# Patient Record
Sex: Female | Born: 1985 | Race: Black or African American | Hispanic: No | Marital: Single | Smoking: Current every day smoker
Health system: Southern US, Community
[De-identification: ages and names within clinical notes are randomized; demographics above are authoritative.]

## PROBLEM LIST (undated history)

## (undated) ENCOUNTER — Inpatient Hospital Stay: Payer: Self-pay

## (undated) DIAGNOSIS — T7840XA Allergy, unspecified, initial encounter: Secondary | ICD-10-CM

## (undated) DIAGNOSIS — G510 Bell's palsy: Secondary | ICD-10-CM

## (undated) DIAGNOSIS — E079 Disorder of thyroid, unspecified: Secondary | ICD-10-CM

## (undated) DIAGNOSIS — L732 Hidradenitis suppurativa: Secondary | ICD-10-CM

## (undated) DIAGNOSIS — R7303 Prediabetes: Secondary | ICD-10-CM

## (undated) HISTORY — DX: Disorder of thyroid, unspecified: E07.9

## (undated) HISTORY — DX: Allergy, unspecified, initial encounter: T78.40XA

## (undated) HISTORY — DX: Hidradenitis suppurativa: L73.2

## (undated) HISTORY — DX: Bell's palsy: G51.0

---
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
## 2008-10-03 ENCOUNTER — Ambulatory Visit: Payer: Self-pay | Admitting: Family Medicine

## 2009-02-10 ENCOUNTER — Inpatient Hospital Stay: Payer: Self-pay | Admitting: Obstetrics and Gynecology

## 2011-08-29 ENCOUNTER — Emergency Department: Payer: Self-pay | Admitting: Emergency Medicine

## 2011-08-29 LAB — URINALYSIS, COMPLETE
Bilirubin,UR: NEGATIVE
Glucose,UR: NEGATIVE mg/dL (ref 0–75)
Ph: 5 (ref 4.5–8.0)
Protein: NEGATIVE
Specific Gravity: 1.013 (ref 1.003–1.030)
Squamous Epithelial: 8

## 2011-08-29 LAB — PREGNANCY, URINE: Pregnancy Test, Urine: NEGATIVE m[IU]/mL

## 2011-08-29 LAB — WET PREP, GENITAL

## 2014-01-24 ENCOUNTER — Emergency Department: Payer: Self-pay | Admitting: Emergency Medicine

## 2014-02-03 DIAGNOSIS — G510 Bell's palsy: Secondary | ICD-10-CM

## 2014-02-03 HISTORY — DX: Bell's palsy: G51.0

## 2014-05-07 ENCOUNTER — Emergency Department
Admit: 2014-05-07 | Disposition: A | Discharge status: Home / Self Care | Payer: Self-pay | Admitting: Emergency Medicine

## 2014-05-07 LAB — COMPREHENSIVE METABOLIC PANEL
ALBUMIN: 3.8 g/dL
ANION GAP: 6 — AB (ref 7–16)
Alkaline Phosphatase: 55 U/L
BUN: 11 mg/dL
Bilirubin,Total: 0.3 mg/dL
CALCIUM: 9 mg/dL
CO2: 25 mmol/L
CREATININE: 0.99 mg/dL
Chloride: 106 mmol/L
EGFR (Non-African Amer.): >60
Glucose: 86 mg/dL
POTASSIUM: 4 mmol/L
SGOT(AST): 24 U/L
SGPT (ALT): 26 U/L
SODIUM: 137 mmol/L
Total Protein: 7.1 g/dL

## 2014-05-07 LAB — CBC WITH DIFFERENTIAL/PLATELET
BASOS ABS: 0 10*3/uL (ref 0.0–0.1)
Basophil %: 0.7 %
EOS ABS: 0.1 10*3/uL (ref 0.0–0.7)
Eosinophil %: 2.1 %
HCT: 41.1 % (ref 35.0–47.0)
HGB: 13.2 g/dL (ref 12.0–16.0)
LYMPHS PCT: 57.7 %
Lymphocyte #: 3.3 10*3/uL (ref 1.0–3.6)
MCH: 28.5 pg (ref 26.0–34.0)
MCHC: 32.1 g/dL (ref 32.0–36.0)
MCV: 89 fL (ref 80–100)
Monocyte #: 0.4 x10 3/mm (ref 0.2–0.9)
Monocyte %: 7.3 %
NEUTROS ABS: 1.9 10*3/uL (ref 1.4–6.5)
NEUTROS PCT: 32.2 %
Platelet: 242 10*3/uL (ref 150–440)
RBC: 4.65 10*6/uL (ref 3.80–5.20)
RDW: 13.3 % (ref 11.5–14.5)
WBC: 5.8 10*3/uL (ref 3.6–11.0)

## 2014-05-09 ENCOUNTER — Encounter: Payer: Self-pay | Admitting: *Deleted

## 2014-05-10 ENCOUNTER — Encounter: Payer: Self-pay | Admitting: Family Medicine

## 2014-05-10 ENCOUNTER — Ambulatory Visit: Payer: Self-pay | Admitting: Family Medicine

## 2014-05-10 NOTE — Progress Notes (Signed)
Patient was here to establish care however her insurance would not go through. 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 She was recently seen in the emergency room at [facility] on April 3 secondary to some numbness and tingling on the side of her face she thought she may have had a stroke. She had CT scan blood work was diagnosed with Bell's palsy. No history of Bell's palsy before. She did have some type of mild GI illness on Wednesday which was about 3 days before the symptoms started where she had some nausea and vomiting she felt bad for a couple days then begin to have neck pain and 17 with on the left side of her face and then the subsequent Bell's palsy. She is currently on prednisone she was given 5 days' worth as well as Valtrex for the next 8 days. She has no particular medical history. She does have seasonal allergies. She does not take medications on a regular basis. She is a smoker. Her family history is significant for diabetes and hypertension.  She was last [facility] by [facility] [place] she was also seen by GYN in January of this year however she had a miscarriage. She states that she had fasting blood work through that clinic but she does not recall the results.  PE-  GEN- NAD, alert and oriented x3 HEENT- PERRL, EOMI, non injected sclera, pink conjunctiva, MMM, oropharynx clear Neck- Supple, no thryomegaly CVS- RRR, no murmur RESP-CTAB Neuro- Left bells palsy of CNVII, decreased sensation, lid closes about 90% on left side, TM clear bilat, nares clear, orophyarnx clear, tongue midline, no other focal deficits EXT- No edema Pulses- Radial,  2+   Bell's Palsy- viral related, complete prednisone, Valtrex, anticipate complete resolution, she is using eye patch and lubricating drops I will obtain [facility]  She will not be billed for this visit

## 2014-05-17 ENCOUNTER — Ambulatory Visit: Payer: Self-pay | Admitting: Family Medicine

## 2014-06-16 ENCOUNTER — Other Ambulatory Visit: Payer: Self-pay | Admitting: Family Medicine

## 2014-06-16 MED ORDER — SULFAMETHOXAZOLE-TRIMETHOPRIM 800-160 MG PO TABS: 1.0000 | ORAL_TABLET | Freq: Two times a day (BID) | ORAL | Status: DC

## 2014-06-16 MED ORDER — FLUCONAZOLE 150 MG PO TABS: 150.0000 mg | ORAL_TABLET | Freq: Once | ORAL | Status: DC

## 2014-06-16 NOTE — Progress Notes (Signed)
Pt with history of recurrent boils in axilla and groin, has had multiple I and D in area. Currently has drainage out of right axilla for past month,   On exam- brief, no fever   Right axilla- draining area of hidadrenitis, TTP, yellow pus, non fluctanct+induration,   Pits and scar tissue noted in left axilla as well      Hidradenitis- will treat with bactrim DS x 10days   Discussed will eventually need surgical removal of area as it will continue to reoccur, no shaving or deodorant to area

## 2014-07-14 ENCOUNTER — Ambulatory Visit (INDEPENDENT_AMBULATORY_CARE_PROVIDER_SITE_OTHER): Payer: Managed Care, Other (non HMO) | Admitting: Family Medicine

## 2014-07-14 ENCOUNTER — Encounter: Payer: Self-pay | Admitting: Family Medicine

## 2014-07-14 VITALS — BP 128/70 | HR 86 | Temp 98.6°F | Resp 14 | Ht 62.0 in | Wt 211.0 lb

## 2014-07-14 DIAGNOSIS — L732 Hidradenitis suppurativa: Secondary | ICD-10-CM | POA: Insufficient documentation

## 2014-07-14 MED ORDER — MUPIROCIN CALCIUM 2 % NA OINT: 1.0000 "application " | TOPICAL_OINTMENT | Freq: Two times a day (BID) | NASAL | Status: DC

## 2014-07-14 MED ORDER — DOXYCYCLINE HYCLATE 100 MG PO TABS: 100.0000 mg | ORAL_TABLET | Freq: Two times a day (BID) | ORAL | Status: DC

## 2014-07-14 NOTE — Progress Notes (Signed)
Patient ID: Ashley Stanton, female   DOB: 02/05/1985, 29 y.o.   MRN: [id number]   Subjective:    Patient ID: Ashley Stanton, female    DOB: 1985/05/09, 29 y.o.   MRN: [id number]  Patient presents for Abscess under R Arm  here with recurrent abscesses beneath her right axilla she also gets on the left. 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 I gave her Bactrim and Diflucan about 2 weeks ago but the area still continues to drink significantly. She's had this on and off for many years and both axilla she also gets boils in her groin. She denies any fever nausea vomiting or other systemic symptoms. She has been treated with Bactroban nasally states that this does help with her staph infections. She also uses Hibiclens    Review Of Systems:  GEN- denies fatigue, fever, weight loss,weakness, recent illness HEENT- denies eye drainage, change in vision, nasal discharge, CVS- denies chest pain, palpitations RESP- denies SOB, cough, wheeze ABD- denies N/V, change in stools, abd pain GU- denies dysuria, hematuria, dribbling, incontinence MSK- denies joint pain, muscle aches, injury Neuro- denies headache, dizziness, syncope, seizure activity       Objective:    BP 128/70 mmHg  Pulse 86  Temp(Src) 98.6 F (37 C) (Oral)  Resp 14  Ht 5\' 2"  (1.575 m)  Wt 211 lb (95.709 kg)  BMI 38.58 kg/m2  LMP 06/28/2014 (Approximate) GEN- NAD, alert and oriented x3 Skin- Right axilla- swelling of axilla - 3cm area, mild fluctance and induration, with yellow drainage from 1cm opening-, TTP,  Left axilla- multiple healed pits           Assessment & Plan:      Problem List Items Addressed This Visit    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 Any transcriptional errors  that result from this process are unintentional.

## 2014-07-14 NOTE — Patient Instructions (Signed)
Surgery referral Take antibiotics as prescribed\ F/U as needed

## 2014-07-14 NOTE — Assessment & Plan Note (Addendum)
Will treat doxy x 7 days Bactroban to nares Based on recurrence will not I and D in office, send to general surgery to perform intervention- urgent appt to be made

## 2014-07-17 ENCOUNTER — Other Ambulatory Visit: Payer: Self-pay | Admitting: *Deleted

## 2014-07-17 MED ORDER — SULFAMETHOXAZOLE-TRIMETHOPRIM 800-160 MG PO TABS: 1.0000 | ORAL_TABLET | Freq: Two times a day (BID) | ORAL | Status: DC

## 2014-07-17 MED ORDER — MUPIROCIN CALCIUM 2 % EX CREA: 1.0000 "application " | TOPICAL_CREAM | Freq: Two times a day (BID) | CUTANEOUS | Status: DC

## 2014-07-21 ENCOUNTER — Other Ambulatory Visit: Payer: Self-pay | Admitting: *Deleted

## 2014-07-21 MED ORDER — MUPIROCIN 2 % EX OINT: 1.0000 "application " | TOPICAL_OINTMENT | Freq: Two times a day (BID) | CUTANEOUS | Status: DC

## 2014-09-19 ENCOUNTER — Telehealth: Payer: Self-pay | Admitting: *Deleted

## 2014-09-19 ENCOUNTER — Other Ambulatory Visit: Payer: Self-pay | Admitting: Family Medicine

## 2014-09-19 DIAGNOSIS — R7982 Elevated C-reactive protein (CRP): Secondary | ICD-10-CM

## 2014-09-19 MED ORDER — VITAMIN D (ERGOCALCIFEROL) 1.25 MG (50000 UNIT) PO CAPS: 50000.0000 [IU] | ORAL_CAPSULE | ORAL | Status: DC

## 2014-09-19 NOTE — Telephone Encounter (Signed)
Reviewed pt wellness labs, elevated HCRP and minimally elevated ferritin, had recent URI otherwise in normal state of health, no bone or joint pain, no muscle aches Wll recheck CRP in 4 weeks with ferritin  Will also replete her Vitamin D

## 2014-09-19 NOTE — Telephone Encounter (Signed)
Received patient labs.   MD reviewed and new orders obtained for Vit D 50,000IU PO Q week x12 weeks.   Prescription sent to pharmacy.

## 2014-10-13 ENCOUNTER — Other Ambulatory Visit: Payer: Self-pay | Admitting: *Deleted

## 2014-10-13 ENCOUNTER — Telehealth: Payer: Self-pay | Admitting: Family Medicine

## 2014-10-13 ENCOUNTER — Other Ambulatory Visit: Payer: Self-pay | Admitting: Family Medicine

## 2014-10-13 ENCOUNTER — Other Ambulatory Visit: Payer: Managed Care, Other (non HMO)

## 2014-10-13 DIAGNOSIS — R7982 Elevated C-reactive protein (CRP): Secondary | ICD-10-CM

## 2014-10-13 NOTE — Telephone Encounter (Signed)
Patient aware.

## 2014-10-13 NOTE — Telephone Encounter (Signed)
Please let patient know that I reviewed her medications with her current pregnancy, there are no good guidelines regarding the higher dose of vitamin D and pregnancy so have her decrease to just 1000 mg a day over-the-counter

## 2014-10-14 LAB — CBC WITH DIFFERENTIAL/PLATELET
BASOS PCT: 0 % (ref 0–1)
Basophils Absolute: 0 10*3/uL (ref 0.0–0.1)
EOS ABS: 0.1 10*3/uL (ref 0.0–0.7)
EOS PCT: 2 % (ref 0–5)
HCT: 36.2 % (ref 36.0–46.0)
Hemoglobin: 11.8 g/dL — ABNORMAL LOW (ref 12.0–15.0)
LYMPHS ABS: 2.6 10*3/uL (ref 0.7–4.0)
Lymphocytes Relative: 37 % (ref 12–46)
MCH: 28.4 pg (ref 26.0–34.0)
MCHC: 32.6 g/dL (ref 30.0–36.0)
MCV: 87 fL (ref 78.0–100.0)
MONOS PCT: 7 % (ref 3–12)
MPV: 9.3 fL (ref 8.6–12.4)
Monocytes Absolute: 0.5 10*3/uL (ref 0.1–1.0)
NEUTROS PCT: 54 % (ref 43–77)
Neutro Abs: 3.8 10*3/uL (ref 1.7–7.7)
PLATELETS: 253 10*3/uL (ref 150–400)
RBC: 4.16 MIL/uL (ref 3.87–5.11)
RDW: 14 % (ref 11.5–15.5)
WBC: 7.1 10*3/uL (ref 4.0–10.5)

## 2014-10-14 LAB — FERRITIN: FERRITIN: 183 ng/mL (ref 10–291)

## 2014-10-14 LAB — HIGH SENSITIVITY CRP: CRP, High Sensitivity: 39.3 mg/L — ABNORMAL HIGH

## 2014-10-16 ENCOUNTER — Other Ambulatory Visit: Payer: Self-pay | Admitting: Family Medicine

## 2014-10-17 LAB — ANA: ANA: NEGATIVE

## 2014-10-17 LAB — SEDIMENTATION RATE: Sed Rate: 36 mm/hr — ABNORMAL HIGH (ref 0–20)

## 2014-11-24 ENCOUNTER — Other Ambulatory Visit: Payer: Self-pay | Admitting: *Deleted

## 2014-11-24 ENCOUNTER — Telehealth: Payer: Self-pay | Admitting: *Deleted

## 2014-11-24 ENCOUNTER — Other Ambulatory Visit: Payer: Self-pay | Admitting: Family Medicine

## 2014-11-24 ENCOUNTER — Other Ambulatory Visit: Payer: Managed Care, Other (non HMO)

## 2014-11-24 DIAGNOSIS — Z3491 Encounter for supervision of normal pregnancy, unspecified, first trimester: Secondary | ICD-10-CM

## 2014-11-24 DIAGNOSIS — Z6839 Body mass index (BMI) 39.0-39.9, adult: Secondary | ICD-10-CM

## 2014-11-24 DIAGNOSIS — O0991 Supervision of high risk pregnancy, unspecified, first trimester: Secondary | ICD-10-CM

## 2014-11-24 DIAGNOSIS — O99211 Obesity complicating pregnancy, first trimester: Secondary | ICD-10-CM

## 2014-11-24 DIAGNOSIS — E669 Obesity, unspecified: Secondary | ICD-10-CM

## 2014-11-24 NOTE — Telephone Encounter (Signed)
Orders placed.   Labs obtained.   Will fax results to [facility] at [phone] phone/ [phone] fax.

## 2014-11-24 NOTE — Telephone Encounter (Signed)
Okay to draw labs

## 2014-11-24 NOTE — Telephone Encounter (Signed)
Patient seen at [facility].   OB requested labs to be drawn as follows: CBC, TSH, HBSAg, RPR, HIV, Type/Rh, antibody screen, HmgA1C, Varicella IGG, Rubella IGG.   Since patient works for [employer], blood work done here will have no out of pocket cost.   Ok to draw labs?

## 2014-11-24 NOTE — Addendum Note (Signed)
Addended by: Phillips OdorSIX, Aalaiyah Yassin H on: 11/24/2014 04:36 PM   Modules accepted: Orders

## 2014-11-25 LAB — ANTIBODY SCREEN: ANTIBODY SCREEN: NEGATIVE

## 2014-11-25 LAB — CBC WITH DIFFERENTIAL/PLATELET
Basophils Absolute: 0 10*3/uL (ref 0.0–0.1)
Basophils Relative: 0 % (ref 0–1)
EOS PCT: 0 % (ref 0–5)
Eosinophils Absolute: 0 10*3/uL (ref 0.0–0.7)
HEMATOCRIT: 34.1 % — AB (ref 36.0–46.0)
Hemoglobin: 11.6 g/dL — ABNORMAL LOW (ref 12.0–15.0)
Lymphocytes Relative: 27 % (ref 12–46)
Lymphs Abs: 2.3 10*3/uL (ref 0.7–4.0)
MCH: 28.9 pg (ref 26.0–34.0)
MCHC: 34 g/dL (ref 30.0–36.0)
MCV: 84.8 fL (ref 78.0–100.0)
MONO ABS: 0.3 10*3/uL (ref 0.1–1.0)
MONOS PCT: 4 % (ref 3–12)
MPV: 9.3 fL (ref 8.6–12.4)
NEUTROS ABS: 5.9 10*3/uL (ref 1.7–7.7)
NEUTROS PCT: 69 % (ref 43–77)
Platelets: 263 10*3/uL (ref 150–400)
RBC: 4.02 MIL/uL (ref 3.87–5.11)
RDW: 13.5 % (ref 11.5–15.5)
WBC: 8.5 10*3/uL (ref 4.0–10.5)

## 2014-11-25 LAB — ACUTE HEP PANEL AND HEP B SURFACE AB
HCV Ab: NEGATIVE
HEP B C IGM: NONREACTIVE
HEP B S AB: POSITIVE — AB
HEP B S AG: NEGATIVE
Hep A IgM: NONREACTIVE

## 2014-11-25 LAB — HEMOGLOBIN A1C
HEMOGLOBIN A1C: 5.2 % (ref <5.7)
MEAN PLASMA GLUCOSE: 103 mg/dL (ref <117)

## 2014-11-25 LAB — HIV ANTIBODY (ROUTINE TESTING W REFLEX): HIV: NONREACTIVE

## 2014-11-25 LAB — TSH: TSH: 0.51 u[IU]/mL (ref 0.350–4.500)

## 2014-11-25 LAB — RH TYPE: RH TYPE: NEGATIVE

## 2014-11-25 LAB — RPR

## 2014-11-27 LAB — VARICELLA ZOSTER ANTIBODY, IGG: Varicella IgG: 792.8 Index — ABNORMAL HIGH (ref <135.00)

## 2014-11-27 LAB — MEASLES/MUMPS/RUBELLA IMMUNITY
Mumps IgG: 35.4 AU/mL — ABNORMAL HIGH (ref <9.00)
RUBEOLA IGG: 21 [AU]/ml (ref <25.00)
Rubella: 7.13 Index — ABNORMAL HIGH (ref <0.90)

## 2014-11-29 LAB — ABO

## 2014-12-20 ENCOUNTER — Other Ambulatory Visit: Payer: Self-pay | Admitting: Family Medicine

## 2014-12-20 ENCOUNTER — Other Ambulatory Visit: Payer: Managed Care, Other (non HMO)

## 2014-12-20 DIAGNOSIS — Z789 Other specified health status: Secondary | ICD-10-CM

## 2014-12-21 LAB — HEPATITIS B SURFACE ANTIBODY, QUANTITATIVE: HEPATITIS B-POST: 457 m[IU]/mL

## 2014-12-26 ENCOUNTER — Ambulatory Visit (INDEPENDENT_AMBULATORY_CARE_PROVIDER_SITE_OTHER): Payer: Managed Care, Other (non HMO) | Admitting: Family Medicine

## 2014-12-26 ENCOUNTER — Encounter: Payer: Self-pay | Admitting: Family Medicine

## 2014-12-26 ENCOUNTER — Other Ambulatory Visit: Payer: Self-pay | Admitting: Family Medicine

## 2014-12-26 VITALS — BP 124/70 | HR 88 | Temp 99.2°F | Resp 16 | Ht 62.0 in | Wt 215.0 lb

## 2014-12-26 DIAGNOSIS — L732 Hidradenitis suppurativa: Secondary | ICD-10-CM

## 2014-12-26 MED ORDER — CEPHALEXIN 500 MG PO CAPS: 500.0000 mg | ORAL_CAPSULE | Freq: Two times a day (BID) | ORAL | Status: DC

## 2014-12-26 NOTE — Assessment & Plan Note (Signed)
S/P I and D  Continue warm compresses She is currently pregnant will treat with Keflex

## 2014-12-26 NOTE — Progress Notes (Signed)
Patient ID: Ashley Stanton, female   DOB: 08/27/1985, 29 y.o.   MRN: [id number]   Subjective:    Patient ID: Ashley InchesSharda A Glassner, female    DOB: 12/26/1985, 29 y.o.   MRN: [id number]  Patient presents for Recurrent Skin Infections   Patient here with recurrent abscess in her axilla. She is diagnosed with hidradenitis. She is currently [redacted] weeks pregnant. She has recurrent episodes of swelling and drainage from the area appear we treated with antibiotics in the past but this time she has significant hardened area in the axilla and it is artery draining. She's not had any fever. No change in her typical pregnancy symptoms of nausea.    Review Of Systems:  GEN- denies fatigue, fever, weight loss,weakness, recent illness HEENT- denies eye drainage, change in vision, nasal discharge, CVS- denies chest pain, palpitations RESP- denies SOB, cough, wheeze ABD- denies N/V, change in stools, abd pain MSK- denies joint pain, muscle aches, injury Neuro- denies headache, dizziness, syncope, seizure activity       Objective:    There were no vitals taken for this visit. GEN- NAD, alert and oriented x3 Skin - Right axilla- abscess- draining yellow/clear fluid, +induration, TTP, mild erythema    2 pits noted in axilla   Procedure- Incision and Drainage Procedure explained to patient questions answered benefits and risks discussed verbal consent obtained. 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 Any transcriptional errors that result from this process are unintentional.

## 2014-12-26 NOTE — Patient Instructions (Signed)
Take antibiotics as prescribed F/U as needed 

## 2014-12-27 ENCOUNTER — Ambulatory Visit: Payer: Self-pay | Admitting: *Deleted

## 2014-12-27 NOTE — Patient Instructions (Signed)
Remove 1inch guaze on Friday F/U with nurse or MD on Monday.

## 2014-12-27 NOTE — Progress Notes (Signed)
Patient ID: Ashley Stanton, female   DOB: 08/12/1985, 29 y.o.   MRN: [id number]  Patient seen in office to F/U I&D on R Axilla.   Moderate drainage of serosanguinous purulent noted.   1 inch of Idoform guaze removed.   Dressing re-applied.   Patient instructed to remove 1 inch Idoform guaze on Friday, 12/29/2014.  Will F/U on Monday 01/01/2015.

## 2014-12-29 LAB — WOUND CULTURE
GRAM STAIN: NONE SEEN
GRAM STAIN: NONE SEEN

## 2015-02-19 ENCOUNTER — Emergency Department
Admission: EM | Admit: 2015-02-19 | Discharge: 2015-02-19 | Disposition: A | Discharge status: Home / Self Care | Payer: Managed Care, Other (non HMO) | Attending: Emergency Medicine | Admitting: Emergency Medicine

## 2015-02-19 ENCOUNTER — Encounter: Payer: Self-pay | Admitting: Emergency Medicine

## 2015-02-19 DIAGNOSIS — M25511 Pain in right shoulder: Secondary | ICD-10-CM | POA: Diagnosis not present

## 2015-02-19 DIAGNOSIS — Z3A24 24 weeks gestation of pregnancy: Secondary | ICD-10-CM | POA: Insufficient documentation

## 2015-02-19 DIAGNOSIS — O99332 Smoking (tobacco) complicating pregnancy, second trimester: Secondary | ICD-10-CM | POA: Diagnosis not present

## 2015-02-19 DIAGNOSIS — R079 Chest pain, unspecified: Secondary | ICD-10-CM

## 2015-02-19 DIAGNOSIS — F17210 Nicotine dependence, cigarettes, uncomplicated: Secondary | ICD-10-CM | POA: Diagnosis not present

## 2015-02-19 DIAGNOSIS — O9989 Other specified diseases and conditions complicating pregnancy, childbirth and the puerperium: Secondary | ICD-10-CM | POA: Insufficient documentation

## 2015-02-19 DIAGNOSIS — Z3493 Encounter for supervision of normal pregnancy, unspecified, third trimester: Secondary | ICD-10-CM

## 2015-02-19 LAB — BASIC METABOLIC PANEL
Anion gap: 9 (ref 5–15)
BUN: 7 mg/dL (ref 6–20)
CALCIUM: 9 mg/dL (ref 8.9–10.3)
CO2: 19 mmol/L — AB (ref 22–32)
CREATININE: 0.68 mg/dL (ref 0.44–1.00)
Chloride: 109 mmol/L (ref 101–111)
GFR calc non Af Amer: >60 mL/min (ref >60)
GLUCOSE: 74 mg/dL (ref 65–99)
Potassium: 3.9 mmol/L (ref 3.5–5.1)
Sodium: 137 mmol/L (ref 135–145)

## 2015-02-19 LAB — URINALYSIS COMPLETE WITH MICROSCOPIC (ARMC ONLY)
BILIRUBIN URINE: NEGATIVE
Bacteria, UA: NONE SEEN
GLUCOSE, UA: NEGATIVE mg/dL
Nitrite: NEGATIVE
Protein, ur: 30 mg/dL — AB
Specific Gravity, Urine: 1.019 (ref 1.005–1.030)
pH: 5 (ref 5.0–8.0)

## 2015-02-19 LAB — CBC
HCT: 34.6 % — ABNORMAL LOW (ref 35.0–47.0)
Hemoglobin: 11.2 g/dL — ABNORMAL LOW (ref 12.0–16.0)
MCH: 28.3 pg (ref 26.0–34.0)
MCHC: 32.5 g/dL (ref 32.0–36.0)
MCV: 87.1 fL (ref 80.0–100.0)
PLATELETS: 222 10*3/uL (ref 150–440)
RBC: 3.97 MIL/uL (ref 3.80–5.20)
RDW: 14 % (ref 11.5–14.5)
WBC: 9 10*3/uL (ref 3.6–11.0)

## 2015-02-19 LAB — TROPONIN I: Troponin I: <0.03 ng/mL (ref <0.031)

## 2015-02-19 MED ORDER — CYCLOBENZAPRINE HCL 10 MG PO TABS: 10.0000 mg | ORAL_TABLET | Freq: Three times a day (TID) | ORAL | Status: DC | PRN

## 2015-02-19 NOTE — ED Notes (Signed)
Pt presents with midsternal chest pain radiating into right shoulder started yesterday. Pt is [redacted] weeks pregnant and denies any pregnancy related issues.

## 2015-02-19 NOTE — Discharge Instructions (Signed)
Chest Wall Pain Chest wall pain is pain in or around the bones and muscles of your chest. Sometimes, an injury causes this pain. Sometimes, the cause may not be known. This pain may take several weeks or longer to get better. HOME CARE INSTRUCTIONS  Pay attention to any changes in your symptoms. Take these actions to help with your pain:   Rest as told by your health care provider.   Avoid activities that cause pain. These include any activities that use your chest muscles or your abdominal and side muscles to lift heavy items.   If directed, apply ice to the painful area:  Put ice in a plastic bag.  Place a towel between your skin and the bag.  Leave the ice on for 20 minutes, 2-3 times per day.  Take over-the-counter and prescription medicines only as told by your health care provider.  Do not use tobacco products, including cigarettes, chewing tobacco, and e-cigarettes. If you need help quitting, ask your health care provider.  Keep all follow-up visits as told by your health care provider. This is important. SEEK MEDICAL CARE IF:  You have a fever.  Your chest pain becomes worse.  You have new symptoms. SEEK IMMEDIATE MEDICAL CARE IF:  You have nausea or vomiting.  You feel sweaty or light-headed.  You have a cough with phlegm (sputum) or you cough up blood.  You develop shortness of breath.   This information is not intended to replace advice given to you by your health care provider. Make sure you discuss any questions you have with your health care provider.   Document Released: 01/20/2005 Document Revised: 10/11/2014 Document Reviewed: 04/17/2014 Elsevier Interactive Patient Education [employer]  Third Trimester of Pregnancy The third trimester is from week 29 through week 42, months 7 through 9. The third trimester is a time when the fetus is growing rapidly. At the end of the ninth month, the fetus is about 20 inches in length and weighs 6-10 pounds.   BODY CHANGES Your body goes through many changes during pregnancy. The changes vary from woman to woman.   Your weight will continue to increase. You can expect to gain 25-35 pounds (11-16 kg) by the end of the pregnancy.  You may begin to get stretch marks on your hips, abdomen, and breasts.  You may urinate more often because the fetus is moving lower into your pelvis and pressing on your bladder.  You may develop or continue to have heartburn as a result of your pregnancy.  You may develop constipation because certain hormones are causing the muscles that push waste through your intestines to slow down.  You may develop hemorrhoids or swollen, bulging veins (varicose veins).  You may have pelvic pain because of the weight gain and pregnancy hormones relaxing your joints between the bones in your pelvis. Backaches may result from overexertion of the muscles supporting your posture.  You may have changes in your hair. These can include thickening of your hair, rapid growth, and changes in texture. Some women also have hair loss during or after pregnancy, or hair that feels dry or thin. Your hair will most likely return to normal after your baby is born.  Your breasts will continue to grow and be tender. A yellow discharge may leak from your breasts called colostrum.  Your belly button may stick out.  You may feel short of breath because of your expanding uterus.  You may notice the fetus "dropping," or moving lower  in your abdomen.  You may have a bloody mucus discharge. This usually occurs a few days to a week before labor begins.  Your cervix becomes thin and soft (effaced) near your due date. WHAT TO EXPECT AT YOUR PRENATAL EXAMS  You will have prenatal exams every 2 weeks until week 36. Then, you will have weekly prenatal exams. During a routine prenatal visit:  You will be weighed to make sure you and the fetus are growing normally.  Your blood pressure is taken.  Your  abdomen will be measured to track your baby's growth.  The fetal heartbeat will be listened to.  Any test results from the previous visit will be discussed.  You may have a cervical check near your due date to see if you have effaced. At around 36 weeks, your caregiver will check your cervix. At the same time, your caregiver will also perform a test on the secretions of the vaginal tissue. This test is to determine if a type of bacteria, Group B streptococcus, is present. Your caregiver will explain this further. Your caregiver may ask you:  What your birth plan is.  How you are feeling.  If you are feeling the baby move.  If you have had any abnormal symptoms, such as leaking fluid, bleeding, severe headaches, or abdominal cramping.  If you are using any tobacco products, including cigarettes, chewing tobacco, and electronic cigarettes.  If you have any questions. Other tests or screenings that may be performed during your third trimester include:  Blood tests that check for low iron levels (anemia).  Fetal testing to check the health, activity level, and growth of the fetus. Testing is done if you have certain medical conditions or if there are problems during the pregnancy.  HIV (human immunodeficiency virus) testing. If you are at high risk, you may be screened for HIV during your third trimester of pregnancy. FALSE LABOR You may feel small, irregular contractions that eventually go away. These are called Braxton Hicks contractions, or false labor. Contractions may last for hours, days, or even weeks before true labor sets in. If contractions come at regular intervals, intensify, or become painful, it is best to be seen by your caregiver.  SIGNS OF LABOR   Menstrual-like cramps.  Contractions that are 5 minutes apart or less.  Contractions that start on the top of the uterus and spread down to the lower abdomen and back.  A sense of increased pelvic pressure or back  pain.  A watery or bloody mucus discharge that comes from the vagina. If you have any of these signs before the 37th week of pregnancy, call your caregiver right away. You need to go to the hospital to get checked immediately. HOME CARE INSTRUCTIONS   Avoid all smoking, herbs, alcohol, and unprescribed drugs. These chemicals affect the formation and growth of the baby.  Do not use any tobacco products, including cigarettes, chewing tobacco, and electronic cigarettes. If you need help quitting, ask your health care provider. You may receive counseling support and other resources to help you quit.  Follow your caregiver's instructions regarding medicine use. There are medicines that are either safe or unsafe to take during pregnancy.  Exercise only as directed by your caregiver. Experiencing uterine cramps is a good sign to stop exercising.  Continue to eat regular, healthy meals.  Wear a good support bra for breast tenderness.  Do not use hot tubs, steam rooms, or saunas.  Wear your seat belt at all times when  driving.  Avoid raw meat, uncooked cheese, cat litter boxes, and soil used by cats. These carry germs that can cause birth defects in the baby.  Take your prenatal vitamins.  Take 1500-2000 mg of calcium daily starting at the 20th week of pregnancy until you deliver your baby.  Try taking a stool softener (if your caregiver approves) if you develop constipation. Eat more high-fiber foods, such as fresh vegetables or fruit and whole grains. Drink plenty of fluids to keep your urine clear or pale yellow.  Take warm sitz baths to soothe any pain or discomfort caused by hemorrhoids. Use hemorrhoid cream if your caregiver approves.  If you develop varicose veins, wear support hose. Elevate your feet for 15 minutes, 3-4 times a day. Limit salt in your diet.  Avoid heavy lifting, wear low heal shoes, and practice good posture.  Rest a lot with your legs elevated if you have leg  cramps or low back pain.  Visit your dentist if you have not gone during your pregnancy. Use a soft toothbrush to brush your teeth and be gentle when you floss.  A sexual relationship may be continued unless your caregiver directs you otherwise.  Do not travel far distances unless it is absolutely necessary and only with the approval of your caregiver.  Take prenatal classes to understand, practice, and ask questions about the labor and delivery.  Make a trial run to the hospital.  Pack your hospital bag.  Prepare the baby's nursery.  Continue to go to all your prenatal visits as directed by your caregiver. SEEK MEDICAL CARE IF:  You are unsure if you are in labor or if your water has broken.  You have dizziness.  You have mild pelvic cramps, pelvic pressure, or nagging pain in your abdominal area.  You have persistent nausea, vomiting, or diarrhea.  You have a bad smelling vaginal discharge.  You have pain with urination. SEEK IMMEDIATE MEDICAL CARE IF:   You have a fever.  You are leaking fluid from your vagina.  You have spotting or bleeding from your vagina.  You have severe abdominal cramping or pain.  You have rapid weight loss or gain.  You have shortness of breath with chest pain.  You notice sudden or extreme swelling of your face, hands, ankles, feet, or legs.  You have not felt your baby move in over an hour.  You have severe headaches that do not go away with medicine.  You have vision changes.   This information is not intended to replace advice given to you by your health care provider. Make sure you discuss any questions you have with your health care provider.   Document Released: 01/14/2001 Document Revised: 02/10/2014 Document Reviewed: 03/23/2012 Elsevier Interactive Patient Education [employer]

## 2015-02-19 NOTE — ED Provider Notes (Signed)
[facility] Emergency Department Provider Note     Time seen: ----------------------------------------- 5:10 PM on 02/19/2015 -----------------------------------------    I have reviewed the triage vital signs and the nursing notes.   HISTORY  Chief Complaint Chest Pain    HPI Latania A Ramnath is a 30 y.o. female who presents ER for midsternal chest pain and right shoulder pain that started yesterday. She states she is [redacted] weeks pregnant, denies any pregnancy related issues such as bleeding or abdominal pain. Movement of the chest and shoulders but her symptoms worse. She denies history of same.   Past Medical History  Diagnosis Date  . Allergy     SEASONAL  . Bell's palsy 05/08/2014    Patient Active Problem List   Diagnosis Date Noted  . Hidradenitis suppurativa of right axilla 07/14/2014    Past Surgical History  Procedure Laterality Date  . Cesarean section  02/11/2009    Allergies Review of patient's allergies indicates no known allergies.  Social History Social History  Substance Use Topics  . Smoking status: Current Every Day Smoker -- 0.50 packs/day    Types: Cigarettes  . Smokeless tobacco: Never Used  . Alcohol Use: 1.2 oz/week    2 Glasses of wine, 0 Cans of beer, 0 Shots of liquor per week    Review of Systems Constitutional: Negative for fever. Eyes: Negative for visual changes. ENT: Negative for sore throat. Cardiovascular: Positive for chest pain Respiratory: Negative for shortness of breath. Gastrointestinal: Negative for abdominal pain, vomiting and diarrhea. Genitourinary: Negative for dysuria. Negative for vaginal bleeding or leakage of fluid Musculoskeletal: Negative for back pain. Positive for shoulder pain Skin: Negative for rash. Neurological: Negative for headaches, focal weakness or numbness.  10-point ROS otherwise negative.  ____________________________________________   PHYSICAL EXAM:  VITAL  SIGNS: ED Triage Vitals  Enc Vitals Group     BP --      Pulse --      Resp --      Temp --      Temp src --      SpO2 --      Weight --      Height --      Head Cir --      Peak Flow --      Pain Score 02/19/15 1435 7     Pain Loc --      Pain Edu? --      Excl. in GC? --     Constitutional: Alert and oriented. Well appearing and in no distress. Eyes: Conjunctivae are normal. PERRL. Normal extraocular movements. ENT   Head: Normocephalic and atraumatic.   Nose: No congestion/rhinnorhea.   Mouth/Throat: Mucous membranes are moist.   Neck: No stridor. Cardiovascular: Normal rate, regular rhythm. Normal and symmetric distal pulses are present in all extremities. No murmurs, rubs, or gallops. Respiratory: Normal respiratory effort without tachypnea nor retractions. Breath sounds are clear and equal bilaterally. No wheezes/rales/rhonchi. Gastrointestinal: Soft and nontender. Gravid uterus Musculoskeletal: Pain with range of motion of the shoulders, particularly on the right. There is right upper chest wall tenderness Neurologic:  Normal speech and language. No gross focal neurologic deficits are appreciated. Speech is normal. No gait instability. Skin:  Skin is warm, dry and intact. No rash noted. Psychiatric: Mood and affect are normal. Speech and behavior are normal. Patient exhibits appropriate insight and judgment. ____________________________________________  EKG: Interpreted by me. Sinus tachycardia with a rate of 103 bpm, normal PR interval, normal QRS, normal  QT interval. Normal axis.  ____________________________________________  ED COURSE:  Pertinent labs & imaging results that were available during my care of the patient were reviewed by me and considered in my medical decision making (see chart for details). Patient is in no acute distress, likely musculoskeletal symptoms. I'll advise Tylenol for pain and prescribed Flexeril as needed.   ____________________________________________    LABS (pertinent positives/negatives)  Labs Reviewed  BASIC METABOLIC PANEL - Abnormal; Notable for the following:    CO2 19 (*)    All other components within normal limits  CBC - Abnormal; Notable for the following:    Hemoglobin 11.2 (*)    HCT 34.6 (*)    All other components within normal limits  URINALYSIS COMPLETEWITH MICROSCOPIC ([facility] ONLY) - Abnormal; Notable for the following:    Color, Urine YELLOW (*)    APPearance HAZY (*)    Ketones, ur TRACE (*)    Hgb urine dipstick 3+ (*)    Protein, ur 30 (*)    Leukocytes, UA 1+ (*)    Squamous Epithelial / LPF 6-30 (*)    All other components within normal limits  TROPONIN I   ____________________________________________  FINAL ASSESSMENT AND PLAN  Chest pain, third trimester pregnancy  Plan: Patient with labs and imaging as dictated above. Patient is in no acute distress, again I will prescribe Flexeril and advise close follow-up with doctor for reevaluation.   Emily FilbertWilliams, Aubriegh Minch E, MD   Emily FilbertJonathan E Jaspreet Bodner, MD 02/19/15 [phone]

## 2015-02-22 LAB — URINE CULTURE: SPECIAL REQUESTS: NORMAL

## 2015-03-15 ENCOUNTER — Encounter: Payer: Self-pay | Admitting: Family Medicine

## 2015-03-15 ENCOUNTER — Ambulatory Visit (INDEPENDENT_AMBULATORY_CARE_PROVIDER_SITE_OTHER): Payer: Managed Care, Other (non HMO) | Admitting: Family Medicine

## 2015-03-15 ENCOUNTER — Other Ambulatory Visit: Payer: Self-pay | Admitting: Family Medicine

## 2015-03-15 VITALS — BP 118/64 | HR 98 | Temp 99.2°F | Resp 18 | Wt 215.0 lb

## 2015-03-15 DIAGNOSIS — J09X2 Influenza due to identified novel influenza A virus with other respiratory manifestations: Secondary | ICD-10-CM

## 2015-03-15 DIAGNOSIS — R509 Fever, unspecified: Secondary | ICD-10-CM

## 2015-03-15 LAB — INFLUENZA A AND B AG, IMMUNOASSAY
INFLUENZA A ANTIGEN: DETECTED — AB
Influenza B Antigen: NOT DETECTED

## 2015-03-15 MED ORDER — OSELTAMIVIR PHOSPHATE 75 MG PO CAPS
75.0000 mg | ORAL_CAPSULE | Freq: Two times a day (BID) | ORAL | Status: DC
Start: 2015-03-15 — End: 2015-06-14

## 2015-03-15 NOTE — Progress Notes (Signed)
Subjective:    Patient ID: Ashley Stanton, female    DOB: [birth year], 30 y.o.   MRN: [id number]  HPI  Patient is a 30 y/o AAF who is [redacted] weeks pregnant who developed fever, body aches, tender cervical LAD, congestion and sore throat yesterday.  Performed flu test this morning which was positive.  Did not have flu shot.   Past Medical History  Diagnosis Date  . Allergy     SEASONAL  . Bell's palsy 05/08/2014   Past Surgical History  Procedure Laterality Date  . Cesarean section  02/11/2009   Current Outpatient Prescriptions on File Prior to Visit  Medication Sig Dispense Refill  . Prenatal Vit-Fe Fumarate-FA (MULTIVITAMIN-PRENATAL) 27-0.8 MG TABS tablet Take 1 tablet by mouth daily at 12 noon.    . valACYclovir (VALTREX) 1000 MG tablet Take 1,000 mg by mouth 2 (two) times daily.    . Vitamin D, Ergocalciferol, (DRISDOL) 50000 UNITS CAPS capsule Take 1 capsule (50,000 Units total) by mouth every 7 (seven) days. Take (1) capsule by mouth every week x12 weeks, then stop. 12 capsule 0  . cyclobenzaprine (FLEXERIL) 10 MG tablet Take 1 tablet (10 mg total) by mouth every 8 (eight) hours as needed for muscle spasms. (Patient not taking: Reported on 03/15/2015) 30 tablet 1   No current facility-administered medications on file prior to visit.   No Known Allergies Social History   Social History  . Marital Status: Single    Spouse Name: N/A  . Number of Children: N/A  . Years of Education: N/A   Occupational History  . Not on file.   Social History Main Topics  . Smoking status: Current Every Day Smoker -- 0.50 packs/day    Types: Cigarettes  . Smokeless tobacco: Never Used  . Alcohol Use: 1.2 oz/week    2 Glasses of wine, 0 Cans of beer, 0 Shots of liquor per week  . Drug Use: No  . Sexual Activity: Yes    Birth Control/ Protection: Condom   Other Topics Concern  . Not on file   Social History Narrative     Review of Systems  All other systems reviewed and are  negative.      Objective:   Physical Exam  Constitutional: She appears well-developed and well-nourished. No distress.  HENT:  Right Ear: External ear normal. Tympanic membrane is erythematous.  Left Ear: Tympanic membrane and external ear normal.  Nose: Nose normal.  Mouth/Throat: Oropharynx is clear and moist. No oropharyngeal exudate.  Eyes: Conjunctivae are normal. Pupils are equal, round, and reactive to light.  Neck: Normal range of motion. Neck supple.  Cardiovascular: Normal rate, regular rhythm and normal heart sounds.   Pulmonary/Chest: Effort normal and breath sounds normal. No respiratory distress. She has no wheezes. She has no rales.  Abdominal: Soft. Bowel sounds are normal.  Lymphadenopathy:    She has cervical adenopathy.  Skin: She is not diaphoretic.  Vitals reviewed.         Assessment & Plan:  Fever, unspecified - Plan: Influenza a and b  Influenza due to identified novel influenza A virus with other respiratory manifestations - Plan: oseltamivir (TAMIFLU) 75 MG capsule   symptoms are consistent with a viral syndrome similar to the flu. Given the positive fu test at work this morning, I will treat the patient as influenza with Tamiflu 75 mg by mouth twice a day for 5 days. Given the fact she is [redacted] weeks pregnant I have recommended Tylenol an  Tylenol only for fever and body aches. Also recommended rest and push fluids. She should not return to work for at least 5 days or until afebrile.

## 2015-04-11 ENCOUNTER — Encounter: Payer: Self-pay | Admitting: Family Medicine

## 2015-04-11 ENCOUNTER — Ambulatory Visit (INDEPENDENT_AMBULATORY_CARE_PROVIDER_SITE_OTHER): Payer: Managed Care, Other (non HMO) | Admitting: Family Medicine

## 2015-04-11 VITALS — BP 118/72 | HR 96 | Temp 98.8°F | Resp 18 | Wt 213.0 lb

## 2015-04-11 DIAGNOSIS — N760 Acute vaginitis: Secondary | ICD-10-CM | POA: Diagnosis not present

## 2015-04-11 DIAGNOSIS — A499 Bacterial infection, unspecified: Secondary | ICD-10-CM | POA: Diagnosis not present

## 2015-04-11 DIAGNOSIS — B9689 Other specified bacterial agents as the cause of diseases classified elsewhere: Secondary | ICD-10-CM

## 2015-04-11 MED ORDER — METRONIDAZOLE 500 MG PO TABS: 500.0000 mg | ORAL_TABLET | Freq: Two times a day (BID) | ORAL | Status: DC

## 2015-04-11 NOTE — Patient Instructions (Signed)
Take antibiotics No bubble baths Return to previous soap We will call with culture results F/U as needed

## 2015-04-11 NOTE — Progress Notes (Signed)
Patient ID: Ashley Stanton, female   DOB: 05/18/1985, 30 y.o.   MRN: [id number]   Subjective:    Patient ID: Ashley Stanton, female    DOB: 10/12/1985, 30 y.o.   MRN: [id number]  Patient presents for Vaginal Pain  Vaginal pain for 5 days, using dial soap, tried new body wash, burning sensation and pain  When she wipes. She also feels a swollen area which she has had boils in the vaginal region the inguinal region as well. She denies any true dysuria or urinary frequency other than her typical for pregnancy. She is currently [redacted] weeks pregnant. She's not had any vaginal bleeding. She has good fetal movement.      Review Of Systems:  GEN- denies fatigue, fever, weight loss,weakness, recent illness HEENT- denies eye drainage, change in vision, nasal discharge, CVS- denies chest pain, palpitations RESP- denies SOB, cough, wheeze ABD- denies N/V, change in stools, abd pain GU- denies dysuria, hematuria, dribbling, incontinence MSK- denies joint pain, muscle aches, injury Neuro- denies headache, dizziness, syncope, seizure activity       Objective:    BP 118/72 mmHg  Pulse 96  Temp(Src) 98.8 F (37.1 C) (Oral)  Resp 18  Wt 213 lb (96.616 kg)  LMP 06/28/2014 (Approximate) GEN- NAD, alert and oriented x3 HEENT- PERRL, EOMI, non injected sclera, pink conjunctiva, MMM, oropharynx clear Neck- Supple, no thyromegaly CVS- RRR, no murmur RESP-CTAB ABD-NABS,soft,NT- GRAVID, no CVA tenderness  GU- normal external genitalia, vaginal mucosa pink and moist, - blind wet prep done, left lower labia erythema and small swelling- TTP, skin irritated, no pus expressed  EXT- No edema Pulses- Radial, 2+  Wet prep- Moderate Clue cells, whitescells , no Yeast         Assessment & Plan:      Problem List Items Addressed This Visit    None    Visit Diagnoses    BV (bacterial vaginosis)    -  Primary    Flagyl for BV , send urine for culture, though no true UTI symptoms, she has small area of  swelling on lower left labia, possible boil starting. No bubble baths    Relevant Medications    metroNIDAZOLE (FLAGYL) 500 MG tablet    Other Relevant Orders    Urinalysis, Routine w reflex microscopic (not [facility])    WET PREP FOR TRICH, YEAST, CLUE    Urine culture    Vaginitis and vulvovaginitis        Return to previous soap       Note: This dictation was prepared with Dragon dictation along with smaller phrase technology. Any transcriptional errors that result from this process are unintentional.

## 2015-04-26 ENCOUNTER — Encounter: Payer: Self-pay | Admitting: Family Medicine

## 2015-05-15 LAB — OB RESULTS CONSOLE GBS: GBS: NEGATIVE

## 2015-06-14 ENCOUNTER — Observation Stay
Admission: EM | Admit: 2015-06-14 | Discharge: 2015-06-14 | Disposition: A | Discharge status: Home / Self Care | Payer: Managed Care, Other (non HMO) | Attending: Obstetrics and Gynecology | Admitting: Obstetrics and Gynecology

## 2015-06-14 DIAGNOSIS — Z3A40 40 weeks gestation of pregnancy: Secondary | ICD-10-CM | POA: Insufficient documentation

## 2015-06-14 DIAGNOSIS — O471 False labor at or after 37 completed weeks of gestation: Principal | ICD-10-CM | POA: Insufficient documentation

## 2015-06-14 DIAGNOSIS — O479 False labor, unspecified: Secondary | ICD-10-CM | POA: Diagnosis present

## 2015-06-14 NOTE — Final Progress Note (Signed)
Physician Final Progress Note  Patient ID: Ashley InchesSharda A Fairbank MRN: [id number] DOB/AGE: 31/05/1985 30 y.o.  Admit date: 06/14/2015 Admitting provider: Vena AustriaAndreas Brennley Curtice, MD Discharge date: 06/14/2015   Admission Diagnoses: Irregular contractions  Discharge Diagnoses:  Active Problems:   Irregular contractions   Consults: None  Significant Findings/ Diagnostic Studies: none  Procedures: NST 150, moderate, variability, +accels, no decels  Discharge Condition: good  Disposition: 01-Home or Self Care  Diet: Regular diet  Discharge Activity: Activity as tolerated  Discharge Instructions    Discharge activity:  No Restrictions    Complete by:  As directed      Fetal Kick Count:  Lie on our left side for one hour after a meal, and count the number of times your baby kicks.  If it is less than 5 times, get up, move around and drink some juice.  Repeat the test 30 minutes later.  If it is still less than 5 kicks in an hour, notify your doctor.    Complete by:  As directed      LABOR:  When conractions begin, you should start to time them from the beginning of one contraction to the beginning  of the next.  When contractions are 5 - 10 minutes apart or less and have been regular for at least an hour, you should call your health care provider.    Complete by:  As directed      No sexual activity restrictions    Complete by:  As directed      Notify physician for bleeding from the vagina    Complete by:  As directed      Notify physician for blurring of vision or spots before the eyes    Complete by:  As directed      Notify physician for chills or fever    Complete by:  As directed      Notify physician for fainting spells, "black outs" or loss of consciousness    Complete by:  As directed      Notify physician for increase in vaginal discharge    Complete by:  As directed      Notify physician for leaking of fluid    Complete by:  As directed      Notify physician for pain or burning  when urinating    Complete by:  As directed      Notify physician for pelvic pressure (sudden increase)    Complete by:  As directed      Notify physician for severe or continued nausea or vomiting    Complete by:  As directed      Notify physician for sudden gushing of fluid from the vagina (with or without continued leaking)    Complete by:  As directed      Notify physician for sudden, constant, or occasional abdominal pain    Complete by:  As directed      Notify physician if baby moving less than usual    Complete by:  As directed             Medication List    STOP taking these medications        metroNIDAZOLE 500 MG tablet  Commonly known as:  FLAGYL     oseltamivir 75 MG capsule  Commonly known as:  TAMIFLU      TAKE these medications        cyclobenzaprine 10 MG tablet  Commonly known as:  FLEXERIL  Take 1 tablet (  10 mg total) by mouth every 8 (eight) hours as needed for muscle spasms.     multivitamin-prenatal 27-0.8 MG Tabs tablet  Take 1 tablet by mouth daily at 12 noon.     valACYclovir 1000 MG tablet  Commonly known as:  VALTREX  Take 1,000 mg by mouth 2 (two) times daily.     Vitamin D (Ergocalciferol) 50000 units Caps capsule  Commonly known as:  DRISDOL  Take 1 capsule (50,000 Units total) by mouth every 7 (seven) days. Take (1) capsule by mouth every week x12 weeks, then stop.           Follow-up Information    Follow up with Letitia Libra, MD On 06/18/2015.   Specialty:  Obstetrics and Gynecology   Contact information:   [address] [place] [address] [phone]       Total time spent taking care of this patient: 10 minutes  Signed: Lorrene Reid 06/14/2015, 4:07 PM

## 2015-06-14 NOTE — Plan of Care (Signed)
Pt presents to l/d with c/o contractions that started this morning. Pt called office and was told to come to l/d so she wouldn't miss her epidural. Pt is a previous c section. [facility] but now states she is scheduled for a csection on Tuesday may 16. Dr Bonney Aidstaebler notified

## 2015-06-14 NOTE — Plan of Care (Signed)
Pt d/c home with d/c instructions.  efm reviewed by dr Bonney Aidstaebler. Pt to return on Tuesday morning at 0530 for scheduled csection . Discussed pain management of on q pump. Pt verbalizes understanding

## 2015-06-17 ENCOUNTER — Inpatient Hospital Stay
Admission: RE | Admit: 2015-06-17 | Payer: Managed Care, Other (non HMO) | Source: Ambulatory Visit | Admitting: Obstetrics & Gynecology

## 2015-06-17 ENCOUNTER — Inpatient Hospital Stay
Admission: EM | Admit: 2015-06-17 | Discharge: 2015-06-20 | DRG: 765 | Disposition: A | Discharge status: Home / Self Care | Payer: Managed Care, Other (non HMO) | Attending: Obstetrics and Gynecology | Admitting: Obstetrics and Gynecology

## 2015-06-17 ENCOUNTER — Observation Stay: Payer: Managed Care, Other (non HMO) | Admitting: Anesthesiology

## 2015-06-17 ENCOUNTER — Encounter
Admission: EM | Disposition: A | Discharge status: Home / Self Care | Payer: Self-pay | Source: Home / Self Care | Attending: Obstetrics and Gynecology

## 2015-06-17 DIAGNOSIS — D62 Acute posthemorrhagic anemia: Secondary | ICD-10-CM | POA: Diagnosis present

## 2015-06-17 DIAGNOSIS — Z6839 Body mass index (BMI) 39.0-39.9, adult: Secondary | ICD-10-CM

## 2015-06-17 DIAGNOSIS — O26893 Other specified pregnancy related conditions, third trimester: Secondary | ICD-10-CM | POA: Diagnosis present

## 2015-06-17 DIAGNOSIS — Z98891 History of uterine scar from previous surgery: Secondary | ICD-10-CM

## 2015-06-17 DIAGNOSIS — O480 Post-term pregnancy: Secondary | ICD-10-CM | POA: Diagnosis present

## 2015-06-17 DIAGNOSIS — Z3A41 41 weeks gestation of pregnancy: Secondary | ICD-10-CM

## 2015-06-17 DIAGNOSIS — Z6741 Type O blood, Rh negative: Secondary | ICD-10-CM

## 2015-06-17 DIAGNOSIS — Z23 Encounter for immunization: Secondary | ICD-10-CM

## 2015-06-17 DIAGNOSIS — F17210 Nicotine dependence, cigarettes, uncomplicated: Secondary | ICD-10-CM | POA: Diagnosis present

## 2015-06-17 DIAGNOSIS — O34211 Maternal care for low transverse scar from previous cesarean delivery: Principal | ICD-10-CM | POA: Diagnosis present

## 2015-06-17 DIAGNOSIS — O99214 Obesity complicating childbirth: Secondary | ICD-10-CM | POA: Diagnosis present

## 2015-06-17 DIAGNOSIS — O99334 Smoking (tobacco) complicating childbirth: Secondary | ICD-10-CM | POA: Diagnosis present

## 2015-06-17 LAB — CBC
HCT: 37 % (ref 35.0–47.0)
Hemoglobin: 12.3 g/dL (ref 12.0–16.0)
MCH: 30.1 pg (ref 26.0–34.0)
MCHC: 33.4 g/dL (ref 32.0–36.0)
MCV: 90.1 fL (ref 80.0–100.0)
Platelets: 160 10*3/uL (ref 150–440)
RBC: 4.1 MIL/uL (ref 3.80–5.20)
RDW: 14.1 % (ref 11.5–14.5)
WBC: 8.9 10*3/uL (ref 3.6–11.0)

## 2015-06-17 LAB — TYPE AND SCREEN
ABO/RH(D): O NEG
ANTIBODY SCREEN: NEGATIVE

## 2015-06-17 SURGERY — Surgical Case
Anesthesia: Spinal | Wound class: Clean Contaminated

## 2015-06-17 MED ORDER — LACTATED RINGERS IV SOLN
INTRAVENOUS | Status: DC | PRN
  Administered 2015-06-17 – 2015-06-18 (×3): via INTRAVENOUS

## 2015-06-17 MED ORDER — BUPIVACAINE HCL (PF) 0.5 % IJ SOLN: 5.0000 mL | Freq: Once | INTRAMUSCULAR | Status: DC

## 2015-06-17 MED ORDER — CEFAZOLIN SODIUM-DEXTROSE 2-4 GM/100ML-% IV SOLN
INTRAVENOUS | Status: AC
  Administered 2015-06-17: 2 g via INTRAVENOUS
  Filled 2015-06-17: qty 100

## 2015-06-17 MED ORDER — OXYTOCIN 40 UNITS IN LACTATED RINGERS INFUSION - SIMPLE MED
INTRAVENOUS | Status: AC
  Administered 2015-06-18 (×2): 4 mL via INTRAVENOUS
  Filled 2015-06-17: qty 1000

## 2015-06-17 MED ORDER — BUPIVACAINE 0.25 % ON-Q PUMP DUAL CATH 400 ML: 400.0000 mL | INJECTION | Status: DC

## 2015-06-17 MED ORDER — CITRIC ACID-SODIUM CITRATE 334-500 MG/5ML PO SOLN
ORAL | Status: AC
  Administered 2015-06-17: 30 mL via ORAL
  Filled 2015-06-17: qty 15

## 2015-06-17 MED ORDER — CITRIC ACID-SODIUM CITRATE 334-500 MG/5ML PO SOLN
30.0000 mL | ORAL | Status: AC
  Administered 2015-06-17: 30 mL via ORAL

## 2015-06-17 MED ORDER — BUPIVACAINE HCL (PF) 0.5 % IJ SOLN
INTRAMUSCULAR | Status: AC
  Filled 2015-06-17: qty 30

## 2015-06-17 MED ORDER — CEFAZOLIN SODIUM-DEXTROSE 2-4 GM/100ML-% IV SOLN
2.0000 g | INTRAVENOUS | Status: AC
  Administered 2015-06-17: 2 g via INTRAVENOUS

## 2015-06-17 SURGICAL SUPPLY — 28 items
CANISTER SUCT 3000ML (MISCELLANEOUS) ×2 IMPLANT
CATH KIT ON-Q SILVERSOAK 5IN (CATHETERS) ×4 IMPLANT
DRSG OPSITE POSTOP 4X10 (GAUZE/BANDAGES/DRESSINGS) IMPLANT
DRSG TELFA 3X8 NADH (GAUZE/BANDAGES/DRESSINGS) ×2 IMPLANT
ELECT CAUTERY BLADE 6.4 (BLADE) ×2 IMPLANT
ELECT REM PT RETURN 9FT ADLT (ELECTROSURGICAL) ×2
ELECTRODE REM PT RTRN 9FT ADLT (ELECTROSURGICAL) ×1 IMPLANT
GAUZE SPONGE 4X4 12PLY STRL (GAUZE/BANDAGES/DRESSINGS) ×2 IMPLANT
GLOVE BIO SURGEON STRL SZ7 (GLOVE) ×6 IMPLANT
GLOVE INDICATOR 7.5 STRL GRN (GLOVE) ×6 IMPLANT
GOWN STRL REUS W/ TWL LRG LVL3 (GOWN DISPOSABLE) ×3 IMPLANT
GOWN STRL REUS W/TWL LRG LVL3 (GOWN DISPOSABLE) ×3
LIQUID BAND (GAUZE/BANDAGES/DRESSINGS) ×2 IMPLANT
NS IRRIG 1000ML POUR BTL (IV SOLUTION) ×2 IMPLANT
PACK C SECTION AR (MISCELLANEOUS) ×2 IMPLANT
PAD OB MATERNITY 4.3X12.25 (PERSONAL CARE ITEMS) ×2 IMPLANT
PAD PREP 24X41 OB/GYN DISP (PERSONAL CARE ITEMS) ×2 IMPLANT
SPONGE LAP 18X18 5 PK (GAUZE/BANDAGES/DRESSINGS) ×4 IMPLANT
STRIP CLOSURE SKIN 1/2X4 (GAUZE/BANDAGES/DRESSINGS) ×2 IMPLANT
SUT CHROMIC GUT BROWN 0 54 (SUTURE) IMPLANT
SUT CHROMIC GUT BROWN 0 54IN (SUTURE)
SUT MNCRL 4-0 (SUTURE) ×1
SUT MNCRL 4-0 27XMFL (SUTURE) ×1
SUT PDS AB 1 TP1 96 (SUTURE) ×2 IMPLANT
SUT PLAIN 2 0 XLH (SUTURE) ×2 IMPLANT
SUT VIC AB 0 CT1 36 (SUTURE) ×6 IMPLANT
SUTURE MNCRL 4-0 27XMF (SUTURE) ×1 IMPLANT
SWABSTK COMLB BENZOIN TINCTURE (MISCELLANEOUS) ×2 IMPLANT

## 2015-06-17 NOTE — Anesthesia Preprocedure Evaluation (Signed)
Anesthesia Evaluation  Patient identified by MRN, date of birth, ID band Patient awake    Reviewed: Allergy & Precautions, NPO status , Patient's Chart, lab work & pertinent test results  Airway Mallampati: II       Dental  (+) Teeth Intact   Pulmonary Current Smoker,     + decreased breath sounds      Cardiovascular Exercise Tolerance: Good  Rhythm:Regular     Neuro/Psych    GI/Hepatic negative GI ROS, Neg liver ROS,   Endo/Other  negative endocrine ROS  Renal/GU negative Renal ROS     Musculoskeletal   Abdominal Normal abdominal exam  (+)   Peds  Hematology negative hematology ROS (+)   Anesthesia Other Findings   Reproductive/Obstetrics                             Anesthesia Physical Anesthesia Plan  ASA: II and emergent  Anesthesia Plan: Spinal   Post-op Pain Management:    Induction:   Airway Management Planned: Natural Airway  Additional Equipment:   Intra-op Plan:   Post-operative Plan:   Informed Consent: I have reviewed the patients History and Physical, chart, labs and discussed the procedure including the risks, benefits and alternatives for the proposed anesthesia with the patient or authorized representative who has indicated his/her understanding and acceptance.     Plan Discussed with: CRNA and Surgeon  Anesthesia Plan Comments:         Anesthesia Quick Evaluation

## 2015-06-17 NOTE — Progress Notes (Signed)
Patient ID: Ashley Stanton, female   DOB: 08/01/1985, 30 y.o.   MRN: [id number] OB History & Physical   History of Present Illness:  Chief Complaint: contractions  HPI:  Ashley Stanton is a 30 y.o. G2P1001 female at [redacted]w[redacted]d dated by 11 week ultrasound.  Her pregnancy has been complicated by obesity with BMI 39, Rh negative status, history of cesaeran section.    She reports contractions since early this morning.   She denies leakage of fluid prior to arrival, but reports gush of fluid while on L&D.   She denies vaginal bleeding.   She reports fetal movement.    Maternal Medical History:   Past Medical History  Diagnosis Date  . Allergy     SEASONAL  . Bell's palsy 05/08/2014   Past Surgical History  Procedure Laterality Date  . Cesarean section  02/11/2009   Allergies  Allergen Reactions  . Citrus     Prior to Admission medications   Medication Sig Start Date End Date Taking? Authorizing Provider  Prenatal Vit-Fe Fumarate-FA (MULTIVITAMIN-PRENATAL) 27-0.8 MG TABS tablet Take 1 tablet by mouth daily at 12 noon.   Yes Historical Provider, MD  Vitamin D, Ergocalciferol, (DRISDOL) 50000 UNITS CAPS capsule Take 1 capsule (50,000 Units total) by mouth every 7 (seven) days. Take (1) capsule by mouth every week x12 weeks, then stop. 09/19/14  Yes Kawanta F [place], MD    OB History  Gravida Para Term Preterm AB SAB TAB Ectopic Multiple Living  2         1    # Outcome Date GA Lbr Len/2nd Weight Sex Delivery Anes PTL Lv  2 Current           1 Gravida               Prenatal care site: [facility]/  Social History: She  reports that she has been smoking Cigarettes.  She has been smoking about 0.50 packs per day. She has never used smokeless tobacco. She reports that she drinks about 1.2 oz of alcohol per week. She reports that she does not use illicit drugs.  Family History: family history includes Alcohol abuse in her father; Asthma in her father; Diabetes in her father; Heart disease  in her maternal grandfather; Hypertension in her maternal grandmother; Miscarriages / Stillbirths in her mother; Stroke in her maternal grandmother.   Review of Systems: Negative x 10 systems reviewed except as noted in the HPI.    Physical Exam:  Vital Signs: BP 126/72 mmHg  Pulse 97  Temp(Src) 98 F (36.7 C) (Oral)  Resp 17  Ht 5' 2" (1.575 m)  Wt 98.884 kg (218 lb)  BMI 39.86 kg/m2  LMP 06/28/2014 (Approximate) General: mild distress with contractions HEENT: normocephalic, atraumatic Heart: regular rate & rhythm.  No murmurs/rubs/gallops Lungs: clear to auscultation bilaterally Abdomen: soft, gravid, non-tender;  EFW: 3,700 grams Pelvic:   External: Normal external female genitalia  Cervix: Dilation: 2 / Effacement (%): 50 /     ROM: + pooling; + nitrazine; + ferning Extremities: non-tender, symmetric, no edema bilaterally.  DTRs: 2+  Neurologic: Alert & oriented x 3.    Pertinent Results:  Prenatal Labs: Blood type/Rh O negative  Antibody screen negative  Rubella Immune  Varicella Immune    RPR NR  HBsAg negative  HIV negative  GC negative  Chlamydia negative  Genetic screening 1st trimester negative  1 hour GTT 150  3 hour GTT 99(h), 143, 125, 96  GBS   negative on 05/15/15   Baseline FHR: 130 beats/min   Variability: moderate   Accelerations: present   Decelerations: present (occasional early, occasional variables) Contractions: present frequency: 3-4 q 10 min Overall assessment: category 1  Assessment:  Ashley Stanton is a 30 y.o. G2P0 female at [redacted]w[redacted]d with history of prior cesarean delivery, SROM  Plan:  1. Admit to Labor & Delivery  2. CBC, T&S, NPO, IVF 3. GBS negative.   4. Fetwal well-being: reassuring 5. To OR for elective repeat cesarean delivery.   Micalah Cabezas D, MD 06/17/2015 11:26 PM    

## 2015-06-18 ENCOUNTER — Inpatient Hospital Stay: Admission: RE | Admit: 2015-06-18 | Payer: Managed Care, Other (non HMO) | Source: Ambulatory Visit

## 2015-06-18 ENCOUNTER — Encounter: Payer: Self-pay | Admitting: *Deleted

## 2015-06-18 DIAGNOSIS — O99334 Smoking (tobacco) complicating childbirth: Secondary | ICD-10-CM | POA: Diagnosis present

## 2015-06-18 DIAGNOSIS — Z6741 Type O blood, Rh negative: Secondary | ICD-10-CM | POA: Diagnosis not present

## 2015-06-18 DIAGNOSIS — Z23 Encounter for immunization: Secondary | ICD-10-CM | POA: Diagnosis not present

## 2015-06-18 DIAGNOSIS — Z6839 Body mass index (BMI) 39.0-39.9, adult: Secondary | ICD-10-CM | POA: Diagnosis not present

## 2015-06-18 DIAGNOSIS — O480 Post-term pregnancy: Secondary | ICD-10-CM | POA: Diagnosis present

## 2015-06-18 DIAGNOSIS — F17210 Nicotine dependence, cigarettes, uncomplicated: Secondary | ICD-10-CM | POA: Diagnosis present

## 2015-06-18 DIAGNOSIS — Z3A41 41 weeks gestation of pregnancy: Secondary | ICD-10-CM | POA: Diagnosis not present

## 2015-06-18 DIAGNOSIS — O99214 Obesity complicating childbirth: Secondary | ICD-10-CM | POA: Diagnosis present

## 2015-06-18 DIAGNOSIS — O34211 Maternal care for low transverse scar from previous cesarean delivery: Secondary | ICD-10-CM | POA: Diagnosis present

## 2015-06-18 DIAGNOSIS — O26893 Other specified pregnancy related conditions, third trimester: Secondary | ICD-10-CM | POA: Diagnosis present

## 2015-06-18 DIAGNOSIS — Z98891 History of uterine scar from previous surgery: Secondary | ICD-10-CM

## 2015-06-18 DIAGNOSIS — D62 Acute posthemorrhagic anemia: Secondary | ICD-10-CM | POA: Diagnosis present

## 2015-06-18 LAB — CBC
HCT: 34.4 % — ABNORMAL LOW (ref 35.0–47.0)
Hemoglobin: 11.4 g/dL — ABNORMAL LOW (ref 12.0–16.0)
MCH: 29.8 pg (ref 26.0–34.0)
MCHC: 33.2 g/dL (ref 32.0–36.0)
MCV: 89.7 fL (ref 80.0–100.0)
Platelets: 137 10*3/uL — ABNORMAL LOW (ref 150–440)
RBC: 3.84 MIL/uL (ref 3.80–5.20)
RDW: 14 % (ref 11.5–14.5)
WBC: 8.5 10*3/uL (ref 3.6–11.0)

## 2015-06-18 LAB — ABO/RH: ABO/RH(D): O NEG

## 2015-06-18 LAB — FETAL SCREEN: Fetal Screen: NEGATIVE

## 2015-06-18 MED ORDER — NALOXONE HCL 0.4 MG/ML IJ SOLN: 0.4000 mg | INTRAMUSCULAR | Status: DC | PRN

## 2015-06-18 MED ORDER — FERROUS SULFATE 325 (65 FE) MG PO TABS
325.0000 mg | ORAL_TABLET | Freq: Every day | ORAL | Status: DC
  Administered 2015-06-19 – 2015-06-20 (×2): 325 mg via ORAL
  Filled 2015-06-18 (×2): qty 1

## 2015-06-18 MED ORDER — ONDANSETRON HCL 4 MG/2ML IJ SOLN
INTRAMUSCULAR | Status: DC | PRN
  Administered 2015-06-18: 4 mg via INTRAVENOUS

## 2015-06-18 MED ORDER — OXYCODONE-ACETAMINOPHEN 5-325 MG PO TABS: 1.0000 | ORAL_TABLET | ORAL | Status: DC | PRN

## 2015-06-18 MED ORDER — DIPHENHYDRAMINE HCL 25 MG PO CAPS: 25.0000 mg | ORAL_CAPSULE | Freq: Four times a day (QID) | ORAL | Status: DC | PRN

## 2015-06-18 MED ORDER — MORPHINE SULFATE (PF) 0.5 MG/ML IJ SOLN
INTRAMUSCULAR | Status: DC | PRN
  Administered 2015-06-17: .2 mg via EPIDURAL

## 2015-06-18 MED ORDER — KETOROLAC TROMETHAMINE 30 MG/ML IJ SOLN
30.0000 mg | Freq: Four times a day (QID) | INTRAMUSCULAR | Status: DC
  Administered 2015-06-18 (×2): 30 mg via INTRAVENOUS
  Filled 2015-06-18 (×2): qty 1

## 2015-06-18 MED ORDER — DEXTROSE 5 % IV SOLN
1.0000 ug/kg/h | INTRAVENOUS | Status: DC | PRN
  Filled 2015-06-18: qty 2

## 2015-06-18 MED ORDER — SCOPOLAMINE 1 MG/3DAYS TD PT72: 1.0000 | MEDICATED_PATCH | Freq: Once | TRANSDERMAL | Status: DC

## 2015-06-18 MED ORDER — IBUPROFEN 600 MG PO TABS: 600.0000 mg | ORAL_TABLET | Freq: Four times a day (QID) | ORAL | Status: DC

## 2015-06-18 MED ORDER — SIMETHICONE 80 MG PO CHEW
80.0000 mg | CHEWABLE_TABLET | Freq: Three times a day (TID) | ORAL | Status: DC
  Administered 2015-06-18 – 2015-06-20 (×5): 80 mg via ORAL
  Filled 2015-06-18 (×5): qty 1

## 2015-06-18 MED ORDER — LACTATED RINGERS IV SOLN: INTRAVENOUS | Status: DC

## 2015-06-18 MED ORDER — PRENATAL MULTIVITAMIN CH
1.0000 | ORAL_TABLET | Freq: Every day | ORAL | Status: DC
  Administered 2015-06-18 – 2015-06-19 (×2): 1 via ORAL
  Filled 2015-06-18 (×2): qty 1

## 2015-06-18 MED ORDER — NALBUPHINE HCL 10 MG/ML IJ SOLN: 5.0000 mg | INTRAMUSCULAR | Status: DC | PRN

## 2015-06-18 MED ORDER — MEPERIDINE HCL 25 MG/ML IJ SOLN: 6.2500 mg | INTRAMUSCULAR | Status: DC | PRN

## 2015-06-18 MED ORDER — COCONUT OIL OIL: 1.0000 "application " | TOPICAL_OIL | Status: DC | PRN

## 2015-06-18 MED ORDER — BUPIVACAINE IN DEXTROSE 0.75-8.25 % IT SOLN
INTRATHECAL | Status: DC | PRN
  Administered 2015-06-17: 1.8 mL via INTRATHECAL

## 2015-06-18 MED ORDER — DIPHENHYDRAMINE HCL 25 MG PO CAPS: 25.0000 mg | ORAL_CAPSULE | ORAL | Status: DC | PRN

## 2015-06-18 MED ORDER — FENTANYL CITRATE (PF) 100 MCG/2ML IJ SOLN: 25.0000 ug | INTRAMUSCULAR | Status: DC | PRN

## 2015-06-18 MED ORDER — OXYTOCIN 40 UNITS IN LACTATED RINGERS INFUSION - SIMPLE MED
2.5000 [IU]/h | INTRAVENOUS | Status: AC
  Administered 2015-06-18: 2.5 [IU]/h via INTRAVENOUS
  Filled 2015-06-18 (×2): qty 1000

## 2015-06-18 MED ORDER — OXYCODONE-ACETAMINOPHEN 5-325 MG PO TABS: 2.0000 | ORAL_TABLET | ORAL | Status: DC | PRN

## 2015-06-18 MED ORDER — SODIUM CHLORIDE 0.9% FLUSH: 3.0000 mL | INTRAVENOUS | Status: DC | PRN

## 2015-06-18 MED ORDER — NALBUPHINE HCL 10 MG/ML IJ SOLN: 5.0000 mg | Freq: Once | INTRAMUSCULAR | Status: DC | PRN

## 2015-06-18 MED ORDER — BUPIVACAINE HCL 0.5 % IJ SOLN
INTRAMUSCULAR | Status: DC | PRN
Start: 2015-06-18 — End: 2015-06-18
  Administered 2015-06-18: 10 mL

## 2015-06-18 MED ORDER — PHENYLEPHRINE HCL 10 MG/ML IJ SOLN
INTRAMUSCULAR | Status: DC | PRN
  Administered 2015-06-18 (×2): 100 ug via INTRAVENOUS

## 2015-06-18 MED ORDER — MENTHOL 3 MG MT LOZG
1.0000 | LOZENGE | OROMUCOSAL | Status: DC | PRN
  Filled 2015-06-18: qty 9

## 2015-06-18 MED ORDER — RHO D IMMUNE GLOBULIN 1500 UNIT/2ML IJ SOSY
300.0000 ug | PREFILLED_SYRINGE | Freq: Once | INTRAMUSCULAR | Status: AC
  Administered 2015-06-18: 300 ug via INTRAVENOUS
  Filled 2015-06-18: qty 2

## 2015-06-18 MED ORDER — ONDANSETRON HCL 4 MG/2ML IJ SOLN: 4.0000 mg | Freq: Three times a day (TID) | INTRAMUSCULAR | Status: DC | PRN

## 2015-06-18 MED ORDER — DIPHENHYDRAMINE HCL 50 MG/ML IJ SOLN: 12.5000 mg | INTRAMUSCULAR | Status: DC | PRN

## 2015-06-18 MED ORDER — ONDANSETRON HCL 4 MG/2ML IJ SOLN
4.0000 mg | Freq: Once | INTRAMUSCULAR | Status: DC | PRN
Start: 2015-06-18 — End: 2015-06-18

## 2015-06-18 MED ORDER — BUPIVACAINE 0.25 % ON-Q PUMP DUAL CATH 400 ML
INJECTION | Status: AC
  Filled 2015-06-18: qty 400

## 2015-06-18 MED ORDER — WITCH HAZEL-GLYCERIN EX PADS: 1.0000 "application " | MEDICATED_PAD | CUTANEOUS | Status: DC | PRN

## 2015-06-18 MED ORDER — BUPIVACAINE ON-Q PAIN PUMP (FOR ORDER SET NO CHG)
INJECTION | Status: DC
  Filled 2015-06-18: qty 1

## 2015-06-18 MED ORDER — FERROUS SULFATE 325 (65 FE) MG PO TABS
325.0000 mg | ORAL_TABLET | Freq: Two times a day (BID) | ORAL | Status: DC
  Administered 2015-06-18: 325 mg via ORAL
  Filled 2015-06-18: qty 1

## 2015-06-18 MED ORDER — IBUPROFEN 600 MG PO TABS
600.0000 mg | ORAL_TABLET | Freq: Four times a day (QID) | ORAL | Status: DC | PRN
  Administered 2015-06-18 – 2015-06-20 (×7): 600 mg via ORAL
  Filled 2015-06-18 (×7): qty 1

## 2015-06-18 MED ORDER — DIBUCAINE 1 % RE OINT: 1.0000 "application " | TOPICAL_OINTMENT | RECTAL | Status: DC | PRN

## 2015-06-18 MED ORDER — SENNOSIDES-DOCUSATE SODIUM 8.6-50 MG PO TABS
2.0000 | ORAL_TABLET | ORAL | Status: DC
  Administered 2015-06-19: 2 via ORAL
  Filled 2015-06-18 (×2): qty 2

## 2015-06-18 MED ORDER — DOCUSATE SODIUM 100 MG PO CAPS
100.0000 mg | ORAL_CAPSULE | Freq: Every day | ORAL | Status: DC
  Administered 2015-06-18 – 2015-06-20 (×3): 100 mg via ORAL
  Filled 2015-06-18 (×3): qty 1

## 2015-06-18 NOTE — Anesthesia Post-op Follow-up Note (Signed)
  Anesthesia Pain Follow-up Note  Patient: Ashley Stanton  Day #: 1  Date of Follow-up: 06/18/2015 Time: 7:24 AM  Last Vitals:  Filed Vitals:   06/18/15 0509 06/18/15 0600  BP: 111/62 117/61  Pulse: 61 69  Temp: 36.9 C 36.8 C  Resp: 17 17    Level of Consciousness: alert  Pain: none   Side Effects:None  Catheter Site Exam:clean, dry, no drainage  Plan: D/C from anesthesia care  [name],  [name]

## 2015-06-18 NOTE — Lactation Note (Signed)
This note was copied from a baby's chart. Lactation Consultation Note  Patient Name: Boy Ashley RinneSharda Chalk FAOZH'YToday's Date: 06/18/2015 Reason for consult: Follow-up assessment Discussed immediate plan of care since having difficulty with latching, sustaining latch and sucking at the breast trying with and without nipple shield.  Mom wants to continue to put him to the breast, pump for stimulation if needed and feed with syringe.  May reevaluate plan if still not latching tomorrow.  Maternal Data Formula Feeding for Exclusion: No Has patient been taught Hand Expression?: Yes Does the patient have breastfeeding experience prior to this delivery?: Yes  Feeding Feeding Type: Breast Fed Length of feed:  (Mostly fuss & bite & then go back to sleep-took 3 to 4 sucks)  LATCH Score/Interventions Latch: Repeated attempts needed to sustain latch, nipple held in mouth throughout feeding, stimulation needed to elicit sucking reflex. Intervention(s): Skin to skin;Teach feeding cues;Waking techniques Intervention(s): Adjust position;Assist with latch;Breast massage;Breast compression  Audible Swallowing: A few with stimulation (Mostly from fingerfeeding using curved tip syringe at breast) Intervention(s): Skin to skin;Hand expression Intervention(s): Skin to skin;Hand expression;Alternate breast massage  Type of Nipple: Flat (Starting to evert more) Intervention(s): Double electric pump  Comfort (Breast/Nipple): Soft / non-tender     Hold (Positioning): Assistance needed to correctly position infant at breast and maintain latch. Intervention(s): Support Pillows;Position options;Skin to skin  LATCH Score: 6  Lactation Tools Discussed/Used Tools: Nipple Shields Nipple shield size: 24   Consult Status      Ashley Stanton, Ashley Stanton 06/18/2015, 6:42 PM

## 2015-06-18 NOTE — Op Note (Signed)
Cesarean Section Procedure Note   Ashley Stanton   06/18/2015   Pre-operative Diagnosis:  1) intrauterine pregnancy at [redacted]w[redacted]d  2) history of c section, desires repeat.  3) SROM, labor  Post-operative Diagnosis:  1) intrauterine pregnancy at [redacted]w[redacted]d  2) history of c section, desires repeat.  3) SROM, labor   Procedure: Repeat low transverse cesarean section via pfannenstiel incision with double layer uterine closure  Surgeon: Surgeon(s) and Role:    * Conard Novak, MD - Primary   Anesthesia: spinal   Findings:  1) normal appearing gravid uterus, fallopian tubes, and ovaries 2) viable female infant with weight of 3,440 grams, APGARs of 8 at 1 minute and 9 at 5 minutes   Estimated Blood Loss: 700 mL  Total IV Fluids: 1,500 ml   Specimens: None  Complications: no complications  Disposition: PACU - hemodynamically stable.   Maternal Condition: stable   Baby condition / location:  Couplet care / Skin to Skin  Procedure Details:  The patient was seen in the Holding Room. The risks, benefits, complications, treatment options, and expected outcomes were discussed with the patient. The patient concurred with the proposed plan, giving informed consent. identified as Ashley Stanton and the procedure verified as C-Section Delivery. A Time Out was held and the above information confirmed.   After induction of anesthesia, the patient was draped and prepped in the usual sterile manner. A Pfannenstiel incision was made and carried down through the subcutaneous tissue to the fascia. Fascial incision was made and extended transversely. The fascia was separated from the underlying rectus tissue superiorly and inferiorly. The peritoneum was identified and entered. Peritoneal incision was extended longitudinally. The bladder flap was bluntly freed from the lower uterine segment. A low transverse uterine incision was made and the hysterotomy was extended with cranial-caudal tension. Delivered from  cephalic presentation was a 3.440 gram Living newborn infant(s) or Female with Apgar scores of 8 at one minute and 9 at five minutes. Cord ph was not sent the umbilical cord was clamped and cut cord blood was obtained for evaluation. The placenta was removed Intact and appeared normal. The uterine outline, tubes and ovaries appeared normal. The uterine incision was closed with running locked sutures of 0 Vicryl.  A second layer of the same suture was thrown in an imbricating fashion.  Hemostasis was assured.  The uterus was returned to the abdomen and the paracolic gutters were cleared of all clots and debris.  The rectus muscles were inspected and found to be hemostatic.  The On-Q catheter pumps were inserted in accordance with the manufacturer's recommendations.  The catheters were inserted approximately 4cm cephelad to the incision line, approximately 1cm apart, straddling the midline.  They were inserted to a depth of the 4th mark. They were positioned superficial to the rectus abdominus muscles and deep to the rectus fascia.    The fascia was then reapproximated with running sutures of 1-0 PDS, looped. The subcuticular closure was performed using 4-0 monocryl. The subcutaneous tissue was reapproximated using 2-0 plain gut such that no greater than 2cm of dead space remained. The skin closure was reinforced using benzoin and 1/2" steri-strips.  The On-Q catheters were bolused with 5 mL of 0.5% marcaine plain for a total of 10 mL.  The catheters were affixed to the skin with surgical skin glue, steri-strips, and tegaderm.    Instrument, sponge, and needle counts were correct prior the abdominal closure and were correct at the conclusion of the  case.  The patient received Ancef 2 gram IV prior to skin incision (within 30 minutes). For VTE prophylaxis she was wearing SCDs throughout the case.   Signed: Conard NovakStephen D. Marria Mathison, MD 06/18/2015 1:19 AM

## 2015-06-18 NOTE — Anesthesia Postprocedure Evaluation (Signed)
Anesthesia Post Note  Patient: Ashley Stanton  Procedure(s) Performed: Procedure(s) (LRB): REPEAT CESAREAN SECTION (N/A)  Patient location during evaluation: Mother Baby Anesthesia Type: Spinal Level of consciousness: oriented and awake and alert Pain management: pain level controlled Vital Signs Assessment: post-procedure vital signs reviewed and stable Respiratory status: spontaneous breathing, respiratory function stable and patient connected to nasal cannula oxygen Cardiovascular status: blood pressure returned to baseline and stable Postop Assessment: no headache and no backache Anesthetic complications: no    Last Vitals:  Filed Vitals:   06/18/15 0509 06/18/15 0600  BP: 111/62 117/61  Pulse: 61 69  Temp: 36.9 C 36.8 C  Resp: 17 17    Last Pain:  Filed Vitals:   06/18/15 0654  PainSc: 1                  [name],  Tildon Silveria A

## 2015-06-18 NOTE — Transfer of Care (Signed)
Immediate Anesthesia Transfer of Care Note  Patient: Ashley Stanton  Procedure(s) Performed: Procedure(s): REPEAT CESAREAN SECTION (N/A)  Patient Location: PACU  Anesthesia Type:Spinal  Level of Consciousness: awake  Airway & Oxygen Therapy: Patient connected to nasal cannula oxygen  Post-op Assessment: Report given to RN and Post -op Vital signs reviewed and stable  Post vital signs: Reviewed and stable  Last Vitals:  Filed Vitals:   06/17/15 2127  BP: 126/72  Pulse: 97  Temp: 36.7 C  Resp: 17    Last Pain: There were no vitals filed for this visit.       Complications: No apparent anesthesia complications

## 2015-06-18 NOTE — Anesthesia Procedure Notes (Signed)
Spinal  Start time: 06/18/2015 11:50 PM End time: 06/18/2015 11:56 PM Reason for block: at surgeon's request Staffing Anesthesiologist: Elijio MilesVAN STAVEREN, Aleaya Latona F Performed by: anesthesiologist  Preanesthetic Checklist Completed: patient identified, site marked, surgical consent, pre-op evaluation, timeout performed, IV checked, risks and benefits discussed, monitors and equipment checked and at surgeon's request Spinal Block Patient position: sitting Prep: Betadine Patient monitoring: heart rate and blood pressure Approach: midline Location: L3-4 Needle Needle type: Quincke  Needle gauge: 25 G Needle length: 9 cm Needle insertion depth: 6 cm Assessment Sensory level: T6

## 2015-06-18 NOTE — H&P (Signed)
Patient ID: Ashley Stanton, female   DOB: September 22, 1985, 30 y.o.   MRN: [id number] OB History & Physical   History of Present Illness:  Chief Complaint: contractions  HPI:  Ashley Stanton is a 30 y.o. G44P1001 female at [redacted]w[redacted]d dated by 11 week ultrasound.  Her pregnancy has been complicated by obesity with BMI 39, Rh negative status, history of cesaeran section.    She reports contractions since early this morning.   She denies leakage of fluid prior to arrival, but reports gush of fluid while on L&D.   She denies vaginal bleeding.   She reports fetal movement.    Maternal Medical History:   Past Medical History  Diagnosis Date  . Allergy     SEASONAL  . Bell's palsy 05/08/2014   Past Surgical History  Procedure Laterality Date  . Cesarean section  02/11/2009   Allergies  Allergen Reactions  . Citrus     Prior to Admission medications   Medication Sig Start Date End Date Taking? Authorizing Provider  Prenatal Vit-Fe Fumarate-FA (MULTIVITAMIN-PRENATAL) 27-0.8 MG TABS tablet Take 1 tablet by mouth daily at 12 noon.   Yes Historical Provider, MD  Vitamin D, Ergocalciferol, (DRISDOL) 50000 UNITS CAPS capsule Take 1 capsule (50,000 Units total) by mouth every 7 (seven) days. Take (1) capsule by mouth every week x12 weeks, then stop. 09/19/14  Yes Salley Scarlet, MD    OB History  Gravida Para Term Preterm AB SAB TAB Ectopic Multiple Living  2         1    # Outcome Date GA Lbr Len/2nd Weight Sex Delivery Anes PTL Lv  2 Current           1 Gravida               Prenatal care site: [facility]/  Social History: She  reports that she has been smoking Cigarettes.  She has been smoking about 0.50 packs per day. She has never used smokeless tobacco. She reports that she drinks about 1.2 oz of alcohol per week. She reports that she does not use illicit drugs.  Family History: family history includes Alcohol abuse in her father; Asthma in her father; Diabetes in her father; Heart disease  in her maternal grandfather; Hypertension in her maternal grandmother; Miscarriages / [place] in her mother; Stroke in her maternal grandmother.   Review of Systems: Negative x 10 systems reviewed except as noted in the HPI.    Physical Exam:  Vital Signs: BP 126/72 mmHg  Pulse 97  Temp(Src) 98 F (36.7 C) (Oral)  Resp 17  Ht  (1.575 m)  Wt 98.884 kg (218 lb)  BMI 39.86 kg/m2  LMP 06/28/2014 (Approximate) General: mild distress with contractions HEENT: normocephalic, atraumatic Heart: regular rate & rhythm.  No murmurs/rubs/gallops Lungs: clear to auscultation bilaterally Abdomen: soft, gravid, non-tender;  EFW: 3,700 grams Pelvic:   External: Normal external female genitalia  Cervix: Dilation: 2 / Effacement (%): 50 /     ROM: + pooling; + nitrazine; + ferning Extremities: non-tender, symmetric, no edema bilaterally.  DTRs: 2+  Neurologic: Alert & oriented x 3.    Pertinent Results:  Prenatal Labs: Blood type/Rh O negative  Antibody screen negative  Rubella Immune  Varicella Immune    RPR NR  HBsAg negative  HIV negative  GC negative  Chlamydia negative  Genetic screening 1st trimester negative  1 hour GTT 150  3 hour GTT 99(h), 143, 125, 96  GBS  negative on 05/15/15   Baseline FHR: 130 beats/min   Variability: moderate   Accelerations: present   Decelerations: present (occasional early, occasional variables) Contractions: present frequency: 3-4 q 10 min Overall assessment: category 1  Assessment:  Ashley Stanton is a 30 y.o. G2P0 female at 381w2d with history of prior cesarean delivery, SROM  Plan:  1. Admit to Labor & Delivery  2. CBC, T&S, NPO, IVF 3. GBS negative.   4. Fetwal well-being: reassuring 5. To OR for elective repeat cesarean delivery.   Conard NovakJackson, Daeton Kluth D, MD 06/17/2015 11:26 PM

## 2015-06-18 NOTE — Discharge Summary (Signed)
OB Discharge Summary  Patient Name: Ashley InchesSharda A Moroni DOB: 04/01/1985 MRN: [id number]  Date of admission: 06/17/2015 Delivering MD: Thomasene MohairStephen Jackson, MD Date of Delivery: 06/18/2015  Date of discharge: 06/20/15  Admitting diagnosis:  1) intrauterine pregnancy at 397w3d  2) history of prior cesarean section, desires repeat 3) SROM, labor  Intrauterine pregnancy: 647w3d     Secondary diagnosis: None     Discharge diagnosis: Term Pregnancy Delivered                                                                                                Post partum procedures:none  Augmentation: none  Complications: None  Hospital course:  The patient presented to L&D with regular uterine contractions.  [facility] delivery in two days.  She had made a small amount of cervical change.  However, she had SROM and greatly desired repeat cesarean section.  She was taken to the operating room where a repeat cesarean delivery was performed without issue (see operative report for details).  Her postpartum course was unremarkable and she was discharged home on POD# 2.  Physical exam  Filed Vitals:   06/19/15 2054 06/19/15 2348 06/20/15 0350 06/20/15 0800  BP: 122/65   110/62  Pulse: 72   65  Temp: 98.1 F (36.7 C) 98.8 F (37.1 C) 98.5 F (36.9 C) 98 F (36.7 C)  TempSrc: Oral Oral Oral Oral  Resp: 18   16  Height:      Weight:      SpO2: 100%       General: alert Lochia: appropriate Uterine Fundus: firm Incision: Healing well with no significant drainage DVT Evaluation: No evidence of DVT seen on physical exam.  Labs:  Lab Results  Component Value Date   WBC 8.9 06/17/2015   HGB 12.3 06/17/2015   HCT 37.0 06/17/2015   MCV 90.1 06/17/2015   PLT 160 06/17/2015  Postpartum HCT: 34.4  Discharge instruction:  Call office if you have any of the following: headache, visual changes, fever >100 F, chills, breast concerns, excessive vaginal  bleeding, incision drainage or problems, leg pain or redness, depression or any other concerns.   Activity: Do not lift > 10 lbs for 6 weeks.  No intercourse or tampons for 6 weeks.  No driving for 1-2 weeks.    Medications:    Medication List    STOP taking these medications        cyclobenzaprine 10 MG tablet  Commonly known as:  FLEXERIL     valACYclovir 1000 MG tablet  Commonly known as:  VALTREX      TAKE these medications        ibuprofen 600 MG tablet  Commonly known as:  ADVIL,MOTRIN  Take 1 tablet (600 mg total) by mouth every 6 (six) hours as needed for mild pain.     multivitamin-prenatal 27-0.8 MG Tabs tablet  Take 1 tablet by mouth daily at 12 noon.     oxyCODONE-acetaminophen 5-325 MG tablet  Commonly known as:  PERCOCET/ROXICET  Take 1-2 tablets by mouth every 4 (  four) hours as needed for moderate pain (pain score 4-7/10).     Vitamin D (Ergocalciferol) 50000 units Caps capsule  Commonly known as:  DRISDOL  Take 1 capsule (50,000 Units total) by mouth every 7 (seven) days. Take (1) capsule by mouth every week x12 weeks, then stop.         Diet: routine diet  Activity: Advance as tolerated. Pelvic rest for 6 weeks.   Outpatient follow up: Follow-up Information    Follow up with Conard Novak, MD. Go on 06/27/2015.   Specialty:  Obstetrics and Gynecology   Why:  06/27/15 at 3 pm for incision check & to order Nexplanon   Contact information:   [address] [place] [address] [phone]       Follow up with Conard Novak, MD. Schedule an appointment as soon as possible for a visit in 6 weeks.   Specialty:  Obstetrics and Gynecology   Why:  postpartum visit and Nexplanon insertion   Contact information:   [address] [place] [address] [phone]      Postpartum contraception: Nexplanon Rhogam Given postpartum: yes Rubella vaccine given postpartum: no Varicella vaccine given postpartum: no TDaP given  antepartum or postpartum: postpartum  Newborn Data: Live born female  Birth Weight: 7 lb 9.3 oz (3440 g) APGAR: 8, 9  Baby Feeding: Bottle and Breast  Disposition:home with mother  SIGNED: Marja Kays, CNM

## 2015-06-18 NOTE — Progress Notes (Signed)
Day of surgery-s/p repeat Cesarean section Subjective:   Tired, hungry. Having trouble getting baby to latch on.   Objective:  Blood pressure 102/57, pulse 64, temperature 98.3 F (36.8 C), temperature source Oral, resp. rate 18, height 5\' 2"  (1.575 m), weight 98.884 kg (218 lb), last menstrual period 06/28/2014, SpO2 100 %, unknown if currently breastfeeding.  General: NAD Pulmonary: no increased work of breathing/ CTA Heart: RRR without murmur Abdomen: non-distended, non-tender, fundus firm at level of umbilicus Incision: dressing C+D+I Extremities:SCDs on Lochia: appropriate Urine output: 1025ml Results for orders placed or performed during the hospital encounter of 06/17/15 (from the past 72 hour(s))  Type and screen [facility]     Status: None   Collection Time: 06/17/15 10:07 PM  Result Value Ref Range   ABO/RH(D) O NEG    Antibody Screen NEG    Sample Expiration 06/20/2015   CBC     Status: None   Collection Time: 06/17/15 10:07 PM  Result Value Ref Range   WBC 8.9 3.6 - 11.0 K/uL   RBC 4.10 3.80 - 5.20 MIL/uL   Hemoglobin 12.3 12.0 - 16.0 g/dL   HCT 16.137.0 09.635.0 - 04.547.0 %   MCV 90.1 80.0 - 100.0 fL   MCH 30.1 26.0 - 34.0 pg   MCHC 33.4 32.0 - 36.0 g/dL   RDW 40.914.1 81.111.5 - 91.414.5 %   Platelets 160 150 - 440 K/uL  ABO/Rh     Status: None   Collection Time: 06/17/15 10:07 PM  Result Value Ref Range   ABO/RH(D) O NEG   CBC     Status: Abnormal   Collection Time: 06/18/15  5:31 AM  Result Value Ref Range   WBC 8.5 3.6 - 11.0 K/uL   RBC 3.84 3.80 - 5.20 MIL/uL   Hemoglobin 11.4 (L) 12.0 - 16.0 g/dL   HCT 78.234.4 (L) 95.635.0 - 21.347.0 %   MCV 89.7 80.0 - 100.0 fL   MCH 29.8 26.0 - 34.0 pg   MCHC 33.2 32.0 - 36.0 g/dL   RDW 08.614.0 57.811.5 - 46.914.5 %   Platelets 137 (L) 150 - 440 K/uL   Baby O positive blood type  Assessment:   30 y.o. G2P1001 postoperativeday # 0-stable    Plan:    DC foley later today  Rhogam indicated  Advance diet as  tolerated  Ambulate later today  Consult lactation  Ashley Stanton, CNM

## 2015-06-19 NOTE — Progress Notes (Signed)
Post Op Day 1 Subjective:   Doing well, ambulating and voiding without difficulty, Tolerating PO intake and pain is controlled with PO medications and On Q pump. Having some increased cramping this am.  Objective:  Blood pressure 107/62, pulse 72, temperature 98.1 F (36.7 C), temperature source Oral, resp. rate 20, height 5\' 2"  (1.575 m), weight 98.884 kg (218 lb), last menstrual period 06/28/2014, SpO2 100 %, breastfeeding/bottle feeding.  General: NAD Pulmonary: no increased work of breathing Abdomen: non-distended, non-tender, fundus firm at level of umbilicus Lochia: minimal Incision: Dressing is C/D/I Extremities: no edema, no erythema, no tenderness   Assessment:   29 y.o. G2P1001 postoperativeday # 1   Plan:  1) Acute blood loss anemia - hemodynamically stable and asymptomatic - po ferrous sulfate  2) O negative/Rhogam administered/Baby is O+/Rubella Immune/Varicella Immune  3) TDAP status: Prenatal record does not reflect UTD status/Pt is uncertain if she received during pregnancy  4) Breast/Bottle/Contraception: Nexplanon  5) Disposition: Home day 2 or 3 depending on progress   Ashley Stanton, CNM

## 2015-06-20 MED ORDER — OXYCODONE-ACETAMINOPHEN 5-325 MG PO TABS: 1.0000 | ORAL_TABLET | ORAL | Status: DC | PRN

## 2015-06-20 MED ORDER — IBUPROFEN 600 MG PO TABS: 600.0000 mg | ORAL_TABLET | Freq: Four times a day (QID) | ORAL | Status: DC | PRN

## 2015-06-20 MED ORDER — TETANUS-DIPHTH-ACELL PERTUSSIS 5-2.5-18.5 LF-MCG/0.5 IM SUSP
0.5000 mL | Freq: Once | INTRAMUSCULAR | Status: AC
  Administered 2015-06-20: 0.5 mL via INTRAMUSCULAR
  Filled 2015-06-20: qty 0.5

## 2015-06-20 NOTE — Discharge Instructions (Signed)
Discharge instructions:  ° °Call office if you have any of the following: headache, visual changes, fever >100 F, chills, breast concerns, excessive vaginal bleeding, incision drainage or problems, leg pain or redness, depression or any other concerns.  ° °Activity: Do not lift > 10 lbs for 6 weeks.  °No intercourse or tampons for 6 weeks.  °No driving for 1-2 weeks.  ° °

## 2015-06-20 NOTE — Progress Notes (Signed)
Pt verbalizes all D/C instructions and the need to attend follow-up appointments. Pt verbalizes understanding about proper removal of On-q pump. Cord clamp and security tag removed from baby. Mom taken downstairs with baby in arms via wheelchair. Victorino DikeJennifer RN

## 2015-06-21 LAB — RHOGAM INJECTION: Unit division: 0

## 2015-07-27 ENCOUNTER — Encounter: Payer: Self-pay | Admitting: Family Medicine

## 2015-09-20 ENCOUNTER — Other Ambulatory Visit: Payer: Self-pay | Admitting: *Deleted

## 2015-09-20 MED ORDER — FLUCONAZOLE 150 MG PO TABS: 150.0000 mg | ORAL_TABLET | Freq: Once | ORAL | 0 refills | Status: AC

## 2015-09-28 ENCOUNTER — Ambulatory Visit (INDEPENDENT_AMBULATORY_CARE_PROVIDER_SITE_OTHER): Payer: Managed Care, Other (non HMO) | Admitting: Family Medicine

## 2015-09-28 ENCOUNTER — Encounter: Payer: Self-pay | Admitting: Family Medicine

## 2015-09-28 VITALS — BP 118/80 | HR 80 | Temp 98.4°F | Resp 16 | Wt 201.0 lb

## 2015-09-28 DIAGNOSIS — H0016 Chalazion left eye, unspecified eyelid: Secondary | ICD-10-CM | POA: Diagnosis not present

## 2015-09-28 MED ORDER — POLYMYXIN B-TRIMETHOPRIM 10000-0.1 UNIT/ML-% OP SOLN: 2.0000 [drp] | OPHTHALMIC | 0 refills | Status: DC

## 2015-09-28 NOTE — Progress Notes (Signed)
   Subjective:    Patient ID: Ashley Stanton, female    DOB: 12/04/1985, 30 y.o.   MRN: [id number]  HPI Patient has a history of staph infections. She developed a painful pustule underneath her left upper eyelid. It is at the border of her eyelashes on the internal conjunctiva. Is approximately 2 mm in diameter.  It is red sore and painful. Using a firm plastic curette I was able to apply direct pressure to this and rupture the boil and drain yellow purulent material. Patient also has a similar lesion on her left lower eyelid that has been there for 2 months. It is approximately 3 mm in diameter. I tried to last this with a 30-gauge needle however the cyst contents are hard like a granuloma. This is perhaps a chalazion versus a cyst on the Meibomian gland.   Past Medical History:  Diagnosis Date  . Allergy    SEASONAL  . Bell's palsy 05/08/2014   Past Surgical History:  Procedure Laterality Date  . CESAREAN SECTION  02/11/2009  . CESAREAN SECTION N/A 06/17/2015   Procedure: REPEAT CESAREAN SECTION;  Surgeon: Conard NovakStephen D Jackson, MD;  Location: [facility] ORS;  Service: Obstetrics;  Laterality: N/A;   No current outpatient prescriptions on file prior to visit.   No current facility-administered medications on file prior to visit.    Allergies  Allergen Reactions  . Citrus    Social History   Social History  . Marital status: Single    Spouse name: N/A  . Number of children: N/A  . Years of education: N/A   Occupational History  . Not on file.   Social History Main Topics  . Smoking status: Current Every Day Smoker    Packs/day: 0.50    Types: Cigarettes  . Smokeless tobacco: Never Used  . Alcohol use 1.2 oz/week    2 Glasses of wine per week  . Drug use: No  . Sexual activity: Yes    Birth control/ protection: Condom   Other Topics Concern  . Not on file   Social History Narrative  . No narrative on file      Review of Systems  All other systems reviewed and are  negative.      Objective:   Physical Exam  Eyes: EOM are normal. Pupils are equal, round, and reactive to light. Right eye exhibits no hordeolum. Left eye exhibits hordeolum. Left eye exhibits no chemosis, no discharge and no exudate. No foreign body present in the left eye. Right conjunctiva is not injected. Left conjunctiva is not injected. Right eye exhibits normal extraocular motion. Left eye exhibits normal extraocular motion.  Cardiovascular: Normal rate, regular rhythm and normal heart sounds.   Pulmonary/Chest: Effort normal and breath sounds normal.  Vitals reviewed.         Assessment & Plan:  Hordeloum, Chalazion. Successfully able to drain the pustule pressure. Begin Polytrim eyedrops every 6 hours in the left eye to ensure resolution and to cover for staph. I will consult ophthalmology for surgical removal of the chalazion.

## 2015-10-01 ENCOUNTER — Other Ambulatory Visit: Payer: Self-pay | Admitting: Family Medicine

## 2015-10-01 DIAGNOSIS — H00036 Abscess of eyelid left eye, unspecified eyelid: Secondary | ICD-10-CM

## 2015-10-01 MED ORDER — ERYTHROMYCIN 5 MG/GM OP OINT: 1.0000 "application " | TOPICAL_OINTMENT | Freq: Every day | OPHTHALMIC | 0 refills | Status: DC

## 2015-10-01 MED ORDER — SULFAMETHOXAZOLE-TRIMETHOPRIM 800-160 MG PO TABS: 1.0000 | ORAL_TABLET | Freq: Two times a day (BID) | ORAL | 0 refills | Status: DC

## 2015-10-12 ENCOUNTER — Ambulatory Visit (INDEPENDENT_AMBULATORY_CARE_PROVIDER_SITE_OTHER): Payer: Managed Care, Other (non HMO) | Admitting: Family Medicine

## 2015-10-12 ENCOUNTER — Encounter: Payer: Self-pay | Admitting: Family Medicine

## 2015-10-12 VITALS — BP 128/64 | HR 84 | Temp 98.9°F | Resp 14 | Ht 63.0 in | Wt 198.0 lb

## 2015-10-12 DIAGNOSIS — E669 Obesity, unspecified: Secondary | ICD-10-CM

## 2015-10-12 DIAGNOSIS — F4321 Adjustment disorder with depressed mood: Secondary | ICD-10-CM | POA: Diagnosis not present

## 2015-10-12 DIAGNOSIS — Z299 Encounter for prophylactic measures, unspecified: Secondary | ICD-10-CM

## 2015-10-12 DIAGNOSIS — Z0000 Encounter for general adult medical examination without abnormal findings: Secondary | ICD-10-CM

## 2015-10-12 DIAGNOSIS — Z113 Encounter for screening for infections with a predominantly sexual mode of transmission: Secondary | ICD-10-CM | POA: Diagnosis not present

## 2015-10-12 DIAGNOSIS — Z418 Encounter for other procedures for purposes other than remedying health state: Secondary | ICD-10-CM | POA: Diagnosis not present

## 2015-10-12 DIAGNOSIS — B353 Tinea pedis: Secondary | ICD-10-CM | POA: Diagnosis not present

## 2015-10-12 DIAGNOSIS — Z13220 Encounter for screening for lipoid disorders: Secondary | ICD-10-CM | POA: Diagnosis not present

## 2015-10-12 DIAGNOSIS — Z124 Encounter for screening for malignant neoplasm of cervix: Secondary | ICD-10-CM | POA: Diagnosis not present

## 2015-10-12 MED ORDER — CLOTRIMAZOLE-BETAMETHASONE 1-0.05 % EX CREA: 1.0000 "application " | TOPICAL_CREAM | Freq: Two times a day (BID) | CUTANEOUS | 0 refills | Status: DC
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
## 2015-10-12 NOTE — Patient Instructions (Signed)
Use cream and the golds bond powder  We will call with lab results Reschedule your eye apppointment F/U as needed

## 2015-10-13 ENCOUNTER — Encounter: Payer: Self-pay | Admitting: Family Medicine

## 2015-10-13 DIAGNOSIS — Z6841 Body Mass Index (BMI) 40.0 and over, adult: Secondary | ICD-10-CM | POA: Insufficient documentation

## 2015-10-13 DIAGNOSIS — E669 Obesity, unspecified: Secondary | ICD-10-CM | POA: Insufficient documentation

## 2015-10-13 LAB — CBC WITH DIFFERENTIAL/PLATELET
BASOS PCT: 0 %
Basophils Absolute: 0 cells/uL (ref 0–200)
EOS PCT: 2 %
Eosinophils Absolute: 82 cells/uL (ref 15–500)
HEMATOCRIT: 39.9 % (ref 35.0–45.0)
Hemoglobin: 13 g/dL (ref 12.0–15.0)
LYMPHS PCT: 49 %
Lymphs Abs: 2009 cells/uL (ref 850–3900)
MCH: 28.7 pg (ref 27.0–33.0)
MCHC: 32.6 g/dL (ref 32.0–36.0)
MCV: 88.1 fL (ref 80.0–100.0)
MONO ABS: 328 {cells}/uL (ref 200–950)
MPV: 9.1 fL (ref 7.5–12.5)
Monocytes Relative: 8 %
Neutro Abs: 1681 cells/uL (ref 1500–7800)
Neutrophils Relative %: 41 %
PLATELETS: 272 10*3/uL (ref 140–400)
RBC: 4.53 MIL/uL (ref 3.80–5.10)
RDW: 14.1 % (ref 11.0–15.0)
WBC: 4.1 10*3/uL (ref 3.8–10.8)

## 2015-10-13 LAB — COMPREHENSIVE METABOLIC PANEL
ALT: 39 U/L — AB (ref 6–29)
AST: 25 U/L (ref 10–30)
Albumin: 4.3 g/dL (ref 3.6–5.1)
Alkaline Phosphatase: 53 U/L (ref 33–115)
BUN: 10 mg/dL (ref 7–25)
CALCIUM: 9.5 mg/dL (ref 8.6–10.2)
CO2: 18 mmol/L — AB (ref 20–31)
Chloride: 107 mmol/L (ref 98–110)
Creat: 1.13 mg/dL — ABNORMAL HIGH (ref 0.50–1.10)
GLUCOSE: 74 mg/dL (ref 70–99)
Potassium: 4.5 mmol/L (ref 3.5–5.3)
SODIUM: 139 mmol/L (ref 135–146)
Total Bilirubin: 0.4 mg/dL (ref 0.2–1.2)
Total Protein: 7.4 g/dL (ref 6.1–8.1)

## 2015-10-13 LAB — HEPATITIS PANEL, ACUTE
HCV Ab: NEGATIVE
HEP B C IGM: NONREACTIVE
HEP B S AG: NEGATIVE
Hep A IgM: NONREACTIVE

## 2015-10-13 LAB — LIPID PANEL
CHOL/HDL RATIO: 2.4 ratio (ref <=5.0)
CHOLESTEROL: 145 mg/dL (ref 125–200)
HDL: 60 mg/dL (ref >=46)
LDL CALC: 76 mg/dL (ref <130)
TRIGLYCERIDES: 46 mg/dL (ref <150)
VLDL: 9 mg/dL (ref <30)

## 2015-10-13 LAB — WET PREP FOR TRICH, YEAST, CLUE
TRICH WET PREP: NONE SEEN
Yeast Wet Prep HPF POC: NONE SEEN

## 2015-10-13 LAB — GC/CHLAMYDIA PROBE AMP
CT PROBE, AMP APTIMA: NOT DETECTED
GC Probe RNA: NOT DETECTED

## 2015-10-13 LAB — HIV ANTIBODY (ROUTINE TESTING W REFLEX): HIV 1&2 Ab, 4th Generation: NONREACTIVE

## 2015-10-13 LAB — RPR

## 2015-10-13 LAB — TSH: TSH: 0.98 mIU/L

## 2015-10-15 LAB — HSV(HERPES SMPLX)ABS-I+II(IGG+IGM)-BLD
HERPES SIMPLEX VRS I-IGM AB (EIA): 1.4 {index} — AB
HSV 1 Glycoprotein G Ab, IgG: <0.9 Index (ref <0.90)
HSV 2 Glycoprotein G Ab, IgG: 3.32 Index — ABNORMAL HIGH (ref <0.90)

## 2015-10-15 LAB — PAP THINPREP ASCUS RFLX HPV RFLX TYPE

## 2015-10-16 MED ORDER — METRONIDAZOLE 500 MG PO TABS: 500.0000 mg | ORAL_TABLET | Freq: Two times a day (BID) | ORAL | 0 refills | Status: DC

## 2015-10-16 NOTE — Addendum Note (Signed)
Addended by: Milinda AntisURHAM, KAWANTA F on: 10/16/2015 12:23 PM   Modules accepted: Orders

## 2015-10-22 ENCOUNTER — Telehealth: Payer: Self-pay | Admitting: Family Medicine

## 2015-10-22 MED ORDER — TRAZODONE HCL 50 MG PO TABS: 25.0000 mg | ORAL_TABLET | Freq: Every evening | ORAL | 3 refills | Status: DC | PRN

## 2015-10-22 NOTE — Telephone Encounter (Signed)
Patient works in my office as a Water quality scientistphlebotomist. After her last visit she has been having difficulty sleeping like something help calm her nerves at night. We'll try trazodone 25-50 mg at bedtime when necessary

## 2015-11-09 ENCOUNTER — Other Ambulatory Visit: Payer: Self-pay | Admitting: *Deleted

## 2015-11-09 MED ORDER — MUPIROCIN 2 % EX OINT: 1.0000 "application " | TOPICAL_OINTMENT | Freq: Two times a day (BID) | CUTANEOUS | 0 refills | Status: DC

## 2015-11-14 ENCOUNTER — Encounter: Payer: Self-pay | Admitting: Physician Assistant

## 2015-11-14 ENCOUNTER — Ambulatory Visit (INDEPENDENT_AMBULATORY_CARE_PROVIDER_SITE_OTHER): Payer: Managed Care, Other (non HMO) | Admitting: Physician Assistant

## 2015-11-14 VITALS — BP 122/80 | HR 91 | Temp 98.1°F | Resp 16 | Wt 197.0 lb

## 2015-11-14 DIAGNOSIS — B9689 Other specified bacterial agents as the cause of diseases classified elsewhere: Secondary | ICD-10-CM

## 2015-11-14 DIAGNOSIS — J988 Other specified respiratory disorders: Secondary | ICD-10-CM

## 2015-11-14 MED ORDER — AZITHROMYCIN 250 MG PO TABS: ORAL_TABLET | ORAL | 0 refills | Status: DC

## 2015-11-14 NOTE — Progress Notes (Signed)
    Patient ID: Phineas InchesSharda A Sally MRN: [id number], DOB: 09/26/1985, 30 y.o. Date of Encounter: 11/14/2015, 10:12 AM    Chief Complaint:  Chief Complaint  Patient presents with  . office visit    sick right ear pain      HPI: 30 y.o. year old female presents with above.   Says that it started with her 852-month-old baby being sick with similar symptoms of congestion. She says that she started getting sick about 10 days ago. Has had cough. Not able to sleep secondary to cough. Also has been having runny nose which has become thick and dark mucus. Her right ear feels achy. Has been taking Alka-Seltzer plus with minimal relief. No fevers or chills. No significant sore throat. No other complaints or concerns.     Home Meds:   Outpatient Medications Prior to Visit  Medication Sig Dispense Refill  . clotrimazole-betamethasone (LOTRISONE) cream Apply 1 application topically 2 (two) times daily. 30 g 0  . etonogestrel (NEXPLANON) 68 MG IMPL implant 1 each (68 mg total) by Subdermal route once. Placed June 2017 1 each 0  . metroNIDAZOLE (FLAGYL) 500 MG tablet Take 1 tablet (500 mg total) by mouth 2 (two) times daily. 14 tablet 0  . mupirocin ointment (BACTROBAN) 2 % Place 1 application into the nose 2 (two) times daily. 22 g 0  . traZODone (DESYREL) 50 MG tablet Take 0.5-1 tablets (25-50 mg total) by mouth at bedtime as needed for sleep. 30 tablet 3   No facility-administered medications prior to visit.     Allergies:  Allergies  Allergen Reactions  . Citrus       Review of Systems: See HPI for pertinent ROS. All other ROS negative.    Physical Exam: Blood pressure 122/80, pulse 91, temperature 98.1 F (36.7 C), temperature source Oral, resp. rate 16, weight 197 lb (89.4 kg), unknown if currently breastfeeding., Body mass index is 34.9 kg/m. General:  WNWD Female. Appears in no acute distress. HEENT: Normocephalic, atraumatic, eyes without discharge, sclera non-icteric, nares are  without discharge. Bilateral auditory canals clear, TM's are without perforation, pearly grey and translucent with reflective cone of light bilaterally. Oral cavity moist, posterior pharynx with minimal erythema, with no exudate, no peritonsillar abscess.  Neck: Supple. No thyromegaly. No lymphadenopathy. Lungs: Clear bilaterally to auscultation without wheezes, rales, or rhonchi. Breathing is unlabored. Heart: Regular rhythm. No murmurs, rubs, or gallops. Msk:  Strength and tone normal for age. Extremities/Skin: Warm and dry. Neuro: Alert and oriented X 3. Moves all extremities spontaneously. Gait is normal. CNII-XII grossly in tact. Psych:  Responds to questions appropriately with a normal affect.     ASSESSMENT AND PLAN:  30 y.o. year old female with  1. Bacterial respiratory infection She is to take antibiotic as directed and complete all of it. Can use over-the-counter medications as needed for symptom relief in the interim. Follow-up if symptoms do not resolve within 1 week after completion of antibiotic. - azithromycin (ZITHROMAX) 250 MG tablet; Day 1: Take 2 daily. Days 2-5: Take 1 daily.  Dispense: 6 tablet; Refill: 0   Signed, [address]Mary Beth HallowellDixon, GeorgiaPA, [employer] 11/14/2015 10:12 AM

## 2015-11-20 ENCOUNTER — Encounter: Payer: Self-pay | Admitting: Family Medicine

## 2015-11-20 ENCOUNTER — Ambulatory Visit (INDEPENDENT_AMBULATORY_CARE_PROVIDER_SITE_OTHER): Payer: Managed Care, Other (non HMO) | Admitting: Family Medicine

## 2015-11-20 VITALS — BP 118/68 | HR 74 | Temp 98.9°F | Resp 14 | Ht 63.0 in | Wt 198.0 lb

## 2015-11-20 DIAGNOSIS — Z113 Encounter for screening for infections with a predominantly sexual mode of transmission: Secondary | ICD-10-CM | POA: Diagnosis not present

## 2015-11-20 DIAGNOSIS — R7989 Other specified abnormal findings of blood chemistry: Secondary | ICD-10-CM

## 2015-11-20 DIAGNOSIS — R945 Abnormal results of liver function studies: Secondary | ICD-10-CM

## 2015-11-20 DIAGNOSIS — L02412 Cutaneous abscess of left axilla: Secondary | ICD-10-CM | POA: Diagnosis not present

## 2015-11-20 MED ORDER — HYDROCODONE-ACETAMINOPHEN 5-325 MG PO TABS: 1.0000 | ORAL_TABLET | Freq: Four times a day (QID) | ORAL | 0 refills | Status: DC | PRN

## 2015-11-20 MED ORDER — SULFAMETHOXAZOLE-TRIMETHOPRIM 800-160 MG PO TABS: 1.0000 | ORAL_TABLET | Freq: Two times a day (BID) | ORAL | 0 refills | Status: DC

## 2015-11-20 NOTE — Patient Instructions (Signed)
Take probiotics Take the antibiotics Pain medicine asneeded Recheck Friday

## 2015-11-20 NOTE — Progress Notes (Signed)
   Subjective:    Patient ID: Ashley Stanton, female    DOB: 08/17/1985, 30 y.o.   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 This is been present for the past week , no fever , no drainage   At her physical last 6 weeks ago she disclosed that her son's father had been unfaithful she's been going through psychotherapy. In interim we start her on trazodone to see if this would help with her sleep.  On her physical STD screen she was found to have Trichomonas she is due for repeat STD screening as well as liver function tests she had mild isolated elevation in liver function  Review Of Systems:  GEN- denies fatigue, fever, weight loss,weakness, recent illness HEENT- denies eye drainage, change in vision, nasal discharge, CVS- denies chest pain, palpitations RESP- denies SOB, cough, wheeze ABD- denies N/V, change in stools, abd pain GU- denies dysuria, hematuria, dribbling, incontinence MSK- denies joint pain, muscle aches, injury Neuro- denies headache, dizziness, syncope, seizure activity       Objective:    BP 118/68 (BP Location: Right Arm, Patient Position: Sitting, Cuff Size: Large)   Pulse 74   Temp 98.9 F (37.2 C) (Oral)   Resp 14   Ht 5\' 3"  (1.6 m)   Wt 198 lb (89.8 kg)   BMI 35.07 kg/m  GEN- NAD, alert and oriented x3 ABD-NABS,soft,NT,ND GU- normal external genitalia, vaginal mucosa pink and moist, cervix visualized no growth, no blood form os, minimal thin clear discharge, no CMT, no ovarian masses, uterus normal size Skin- left axilla- large golf ball size abscess, fluctant, TTP, +erythema  EXT- No edema   Procedure- Incision and Drainage Procedure explained to patient questions answered benefits and risks discussed written consent obtained. Antiseptic-Betadine Anesthesia-lidocaine Incision performed large amount of pus expressed   Culture taken Minimal blood loss 5 inches of packing placed  Patient tolerated procedure well Bandage applied         Assessment & Plan:      Problem List Items Addressed This Visit    None    Visit Diagnoses    Screen for STD (sexually transmitted disease)    -  Primary   treated for trichomonas, recheck   Relevant Orders   WET PREP FOR TRICH, YEAST, CLUE   GC/Chlamydia Probe Amp   Elevated LFTs       likley transient or false elevation, recheck   Relevant Orders   Comprehensive metabolic panel   Abscess of left axilla       Start bactrim, given probiotics recent antibiotic use, Restora samples. Remove packing on Friday,norco for pain    Relevant Orders   WOUND CULTURE      Note: This dictation was prepared with Dragon dictation along with smaller phrase technology. Any transcriptional errors that result from this process are unintentional.

## 2015-11-21 ENCOUNTER — Ambulatory Visit: Payer: Self-pay | Admitting: Family Medicine

## 2015-11-21 LAB — GC/CHLAMYDIA PROBE AMP
CT PROBE, AMP APTIMA: NOT DETECTED
GC Probe RNA: NOT DETECTED

## 2015-11-21 LAB — WET PREP FOR TRICH, YEAST, CLUE
Trich, Wet Prep: NONE SEEN
YEAST WET PREP: NONE SEEN

## 2015-11-21 NOTE — Addendum Note (Signed)
Addended by: Phillips OdorSIX, CHRISTINA H on: 11/21/2015 02:52 PM   Modules accepted: Orders

## 2015-11-22 LAB — COMPREHENSIVE METABOLIC PANEL
ALK PHOS: 58 U/L (ref 33–115)
ALT: 17 U/L (ref 6–29)
AST: 17 U/L (ref 10–30)
Albumin: 3.8 g/dL (ref 3.6–5.1)
BILIRUBIN TOTAL: 0.3 mg/dL (ref 0.2–1.2)
BUN: 9 mg/dL (ref 7–25)
CO2: 20 mmol/L (ref 20–31)
Calcium: 9.4 mg/dL (ref 8.6–10.2)
Chloride: 106 mmol/L (ref 98–110)
Creat: 0.93 mg/dL (ref 0.50–1.10)
GLUCOSE: 89 mg/dL (ref 70–99)
POTASSIUM: 4.6 mmol/L (ref 3.5–5.3)
Sodium: 138 mmol/L (ref 135–146)
Total Protein: 7.1 g/dL (ref 6.1–8.1)

## 2015-11-23 LAB — WOUND CULTURE
Gram Stain: NONE SEEN
Organism ID, Bacteria: NORMAL

## 2015-11-26 LAB — HSV(HERPES SIMPLEX VRS) I + II AB-IGG
HSV 1 Glycoprotein G Ab, IgG: <0.9 Index (ref <0.90)
HSV 2 GLYCOPROTEIN G AB, IGG: 4.12 {index} — AB (ref <0.90)

## 2015-11-28 LAB — HSV 1/2 AB (IGM), IFA W/RFLX TITER
HSV 1 IGM SCREEN: NEGATIVE
HSV 2 IGM SCREEN: NEGATIVE

## 2016-01-02 ENCOUNTER — Ambulatory Visit (INDEPENDENT_AMBULATORY_CARE_PROVIDER_SITE_OTHER): Payer: Managed Care, Other (non HMO) | Admitting: Family Medicine

## 2016-01-02 ENCOUNTER — Encounter: Payer: Self-pay | Admitting: Family Medicine

## 2016-01-02 VITALS — BP 110/80 | HR 84 | Temp 98.4°F | Resp 16 | Ht 63.0 in | Wt 208.0 lb

## 2016-01-02 DIAGNOSIS — E6609 Other obesity due to excess calories: Secondary | ICD-10-CM | POA: Diagnosis not present

## 2016-01-02 DIAGNOSIS — G5602 Carpal tunnel syndrome, left upper limb: Secondary | ICD-10-CM

## 2016-01-02 DIAGNOSIS — Z6836 Body mass index (BMI) 36.0-36.9, adult: Secondary | ICD-10-CM | POA: Diagnosis not present

## 2016-01-02 DIAGNOSIS — B351 Tinea unguium: Secondary | ICD-10-CM | POA: Diagnosis not present

## 2016-01-02 MED ORDER — METHYLPREDNISOLONE 4 MG PO TBPK: ORAL_TABLET | ORAL | 0 refills | Status: DC

## 2016-01-02 MED ORDER — TERBINAFINE HCL 250 MG PO TABS: 250.0000 mg | ORAL_TABLET | Freq: Every day | ORAL | 2 refills | Status: DC

## 2016-01-02 MED ORDER — PHENTERMINE HCL 37.5 MG PO TABS: 37.5000 mg | ORAL_TABLET | Freq: Every day | ORAL | 1 refills | Status: DC

## 2016-01-02 NOTE — Progress Notes (Signed)
Subjective:    Patient ID: Ashley Stanton, female    DOB: 10/08/1985, 30 y.o.   MRN: [id number]  Patient presents for pain in both arms and hands and discuss weight loss Issue here with a few concerns. She's been having numbness pain worse in her left hand compared to the right. At nighttime she has to shake it out and feels like it is throbbing. It is worse when she is typing or drawing blood fingers into go numb at that time. She has not tried any pain medication this is been going on for at least the past couple months.  The tinea pedis is cleared up somewhat the Lotrisone but she is concerned about her 2 great toenails they're very thick yellow is starting to break off.  Obesity this is the heaviest she has been when she has not been pregnant. She admits that she has been overeating but states that her stress levels are much improved and now she just enjoying herself but she is eating out a lot with fast food. She will like something to curve her appetite. Her mother has been on phentermine and this has worked well for her for weight loss. No she's no longer on the trazodone. She is still seeing her therapist once or twice a month for maintenance at this time    Review Of Systems:  GEN- denies fatigue, fever, weight loss,weakness, recent illness HEENT- denies eye drainage, change in vision, nasal discharge, CVS- denies chest pain, palpitations RESP- denies SOB, cough, wheeze ABD- denies N/V, change in stools, abd pain GU- denies dysuria, hematuria, dribbling, incontinence MSK-+joint pain, muscle aches, injury Neuro- denies headache, dizziness, syncope, seizure activity       Objective:    BP 110/80   Pulse 84   Temp 98.4 F (36.9 C) (Oral)   Resp 16   Ht 5\' 3"  (1.6 m)   Wt 208 lb (94.3 kg)   BMI 36.85 kg/m  GEN- NAD, alert and oriented x3 HEENT- PERRL, EOMI, non injected sclera, pink conjunctiva, MMM, oropharynx clear Neck- Supple, no thyromegaly, neg spurlings  CVS- RRR,  no murmur RESP-CTAB Skin- thick yellow brittle great toenails bilat, peeling skin, mild maceration between toes bilat MSK- FROM upper ext, +phalens and tinels L >R, NEG Finkelsteins, FROM Wrist, FROM Elbow        Assessment & Plan:      Problem List Items Addressed This Visit    Obesity - Primary    We'll plan to try her on phentermine she will start with a half a tablet once a day for couple weeks and then increase to one full tablet discussed the side effects of the medication we also discussed dietary plan. 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 We'll try her on terbinafine for the next 12 weeks. She will have a repeat liver function in 6 weeks she just had a recent one that was normal.  With regards to the carpal tunnel of getting her prescription for wrist brace. She is high risk because of her occupation for this. I've also given her a Medrol Dosepak. Note she will not start the phentermine until after the steroid      Relevant Medications   phentermine (ADIPEX-P) 37.5 MG tablet    Other Visit Diagnoses    Nail fungus       Relevant Medications   terbinafine (LAMISIL) 250 MG tablet  Carpal tunnel syndrome of left wrist   Is above conservative measures do not work and we will proceed with nerve conduction study. Note she also carries a lot of her baggage for her infant on her left side      Relevant Medications   phentermine (ADIPEX-P) 37.5 MG tablet      Note: This dictation was prepared with Dragon dictation along with smaller phrase technology. Any transcriptional errors that result from this process are unintentional.

## 2016-01-02 NOTE — Assessment & Plan Note (Addendum)
We'll plan to try her on phentermine she will start with a half a tablet once a day for couple weeks and then increase to one full tablet discussed the side effects of the medication we also discussed dietary plan. 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 We'll try her on terbinafine for the next 12 weeks. She will have a repeat liver function in 6 weeks she just had a recent one that was normal.  With regards to the carpal tunnel of getting her prescription for wrist brace. She is high risk because of her occupation for this. I've also given her a Medrol Dosepak. Note she will not start the phentermine until after the steroid

## 2016-01-02 NOTE — Patient Instructions (Addendum)
Increase protein- yogurt, chicken, fis  Lower carb- avoid bread, pasta, potatoes, SODA No candy Take the anti-fungal for your feet  Medrol for inflammation  Phentermine start after the medrol dose pak F/U 6 weeks

## 2016-02-07 ENCOUNTER — Other Ambulatory Visit: Payer: Self-pay | Admitting: *Deleted

## 2016-02-07 ENCOUNTER — Other Ambulatory Visit: Payer: Self-pay

## 2016-02-07 DIAGNOSIS — Z79899 Other long term (current) drug therapy: Secondary | ICD-10-CM

## 2016-02-07 LAB — COMPREHENSIVE METABOLIC PANEL
ALBUMIN: 3.8 g/dL (ref 3.6–5.1)
ALK PHOS: 73 U/L (ref 33–115)
ALT: 13 U/L (ref 6–29)
AST: 15 U/L (ref 10–30)
BILIRUBIN TOTAL: 0.3 mg/dL (ref 0.2–1.2)
BUN: 10 mg/dL (ref 7–25)
CO2: 21 mmol/L (ref 20–31)
CREATININE: 1.02 mg/dL (ref 0.50–1.10)
Calcium: 8.9 mg/dL (ref 8.6–10.2)
Chloride: 104 mmol/L (ref 98–110)
GLUCOSE: 111 mg/dL — AB (ref 70–99)
Potassium: 4.2 mmol/L (ref 3.5–5.3)
SODIUM: 138 mmol/L (ref 135–146)
Total Protein: 6.7 g/dL (ref 6.1–8.1)

## 2016-02-08 ENCOUNTER — Other Ambulatory Visit: Payer: Self-pay | Admitting: *Deleted

## 2016-02-08 MED ORDER — TERBINAFINE HCL 250 MG PO TABS: 250.0000 mg | ORAL_TABLET | Freq: Every day | ORAL | 0 refills | Status: DC

## 2016-03-12 ENCOUNTER — Ambulatory Visit (INDEPENDENT_AMBULATORY_CARE_PROVIDER_SITE_OTHER): Payer: Managed Care, Other (non HMO) | Admitting: Family Medicine

## 2016-03-12 ENCOUNTER — Encounter: Payer: Self-pay | Admitting: Family Medicine

## 2016-03-12 VITALS — BP 112/72 | HR 88 | Temp 98.6°F | Resp 14 | Ht 63.0 in | Wt 218.0 lb

## 2016-03-12 DIAGNOSIS — B353 Tinea pedis: Secondary | ICD-10-CM

## 2016-03-12 DIAGNOSIS — G4700 Insomnia, unspecified: Secondary | ICD-10-CM | POA: Diagnosis not present

## 2016-03-12 DIAGNOSIS — Z6838 Body mass index (BMI) 38.0-38.9, adult: Secondary | ICD-10-CM

## 2016-03-12 DIAGNOSIS — E6609 Other obesity due to excess calories: Secondary | ICD-10-CM | POA: Diagnosis not present

## 2016-03-12 NOTE — Progress Notes (Signed)
Subjective:    Patient ID: Ashley Stanton, female    DOB: [birth year], 31 y.o.   MRN: [id number]  Patient presents for Follow-up (is not fasting) and Mood Swings (states that she is having swings from anger to weepy)   Pt here to f/u Was last seen in November at that time was having difficulty with carpal tunnel she was given a wrist brace to useThis has improved with the use of the brace.  We also discussed her obesity was started on phentermine at that time she is not having difficulty with her mood and her weight was 208 pounds her weight is up 10 pounds since that visit.She is trying to get to the gym but has difficulty with the babysitter she admits that she enjoys food and overeats. She did have a reaction to the phentermine this gave her headaches and made her feel weird and therefore she came off at this couple weeks ago.  Today she states that she's been having mood swings sometimes she is angry or feels irritable. Symptoms are worse when she does not sleep. Sometimes she has vivid dreams, Denies feeling sad, tearful or depressed. She still follows with therapist Twice a month and she states that she has had good reports.. She is in the middle of transitioning her job. She is a single parent with 2 young children. He has had some difficulty with her son's father with his care which frustrates her. In the past and given her trazodone to help with sleep but she did not take this very often as it made her felt she was hungry over the next morning.  Tinea pedis- miss a few weeks of her antifungal she is now back on track. She normal liver function 4 weeks ago Review Of Systems:  GEN- denies fatigue, fever, weight loss,weakness, recent illness HEENT- denies eye drainage, change in vision, nasal discharge, CVS- denies chest pain, palpitations RESP- denies SOB, cough, wheeze ABD- denies N/V, change in stools, abd pain GU- denies dysuria, hematuria, dribbling, incontinence MSK- denies joint  pain, muscle aches, injury Neuro- denies headache, dizziness, syncope, seizure activity       Objective:    BP 112/72   Pulse 88   Temp 98.6 F (37 C) (Oral)   Resp 14   Ht 5\' 3"  (1.6 m)   Wt 218 lb (98.9 kg)   BMI 38.62 kg/m  GEN- NAD, alert and oriented x3 HEENT- PERRL, EOMI, non injected sclera, pink conjunctiva, MMM, oropharynx clear CVS- RRR, no murmur RESP-CTAB Psych - normal affect and mood, not depressed, not anxious appearing,  Skin- thick great toenails bilat, , decreased maceration between toes bilat, no erythema  hyperpigmented scab left midfoot          Assessment & Plan:      Problem List Items Addressed This Visit    Tinea pedis    Complete oral lamisil, can use topical cortisone on the mid foot and moisturize      Obesity - Primary   Insomnia    I think her insomnia is causing her to be more-year-old. She has a lot of of life-changing decisions to make. She'll be starting a new job and her daughters and a new school. She is trying to do a lot on her own is a single parent. I do not think she needs any type of antidepressant at this time and she agrees. We will try melatonin for sleep encourage working out even from home this does make her  feel better. We also discussed eating habits and how food can be an indication for some people. We discussed ways she can cut back on her carbs and her sugars and healthy meal plan. She also has some handouts.  She will let me know if there are any changes with regards to her mood or feelings of depression         Note: This dictation was prepared with Dragon dictation along with smaller phrase technology. Any transcriptional errors that result from this process are unintentional.

## 2016-03-12 NOTE — Assessment & Plan Note (Signed)
Complete oral lamisil, can use topical cortisone on the mid foot and moisturize

## 2016-03-12 NOTE — Patient Instructions (Addendum)
Use melatonin sleep Execise/work on the diet  Myfitness Pal APP Sept for Physical

## 2016-03-12 NOTE — Assessment & Plan Note (Addendum)
I think her insomnia is causing her to be more-year-old. She has a lot of of life-changing decisions to make. She'll be starting a new job and her daughters and a new school. She is trying to do a lot on her own is a single parent. I do not think she needs any type of antidepressant at this time and she agrees. We will try melatonin for sleep encourage working out even from home this does make her feel better. We also discussed eating habits and how food can be an indication for some people. We discussed ways she can cut back on her carbs and her sugars and healthy meal plan. She also has some handouts.  She will let me know if there are any changes with regards to her mood or feelings of depression

## 2016-04-18 ENCOUNTER — Encounter: Payer: Self-pay | Admitting: *Deleted

## 2016-04-18 ENCOUNTER — Emergency Department
Admission: EM | Admit: 2016-04-18 | Discharge: 2016-04-18 | Disposition: A | Discharge status: Home / Self Care | Payer: Managed Care, Other (non HMO) | Attending: Emergency Medicine | Admitting: Emergency Medicine

## 2016-04-18 DIAGNOSIS — L0291 Cutaneous abscess, unspecified: Secondary | ICD-10-CM

## 2016-04-18 DIAGNOSIS — F17210 Nicotine dependence, cigarettes, uncomplicated: Secondary | ICD-10-CM | POA: Insufficient documentation

## 2016-04-18 DIAGNOSIS — Z0189 Encounter for other specified special examinations: Secondary | ICD-10-CM

## 2016-04-18 DIAGNOSIS — Z7689 Persons encountering health services in other specified circumstances: Secondary | ICD-10-CM

## 2016-04-18 DIAGNOSIS — L02414 Cutaneous abscess of left upper limb: Secondary | ICD-10-CM | POA: Insufficient documentation

## 2016-04-18 MED ORDER — SULFAMETHOXAZOLE-TRIMETHOPRIM 800-160 MG PO TABS: 1.0000 | ORAL_TABLET | Freq: Two times a day (BID) | ORAL | 0 refills | Status: DC

## 2016-04-18 MED ORDER — LIDOCAINE-EPINEPHRINE 2 %-1:100000 IJ SOLN
INTRAMUSCULAR | Status: AC
  Filled 2016-04-18: qty 1.7

## 2016-04-18 MED ORDER — LIDOCAINE-EPINEPHRINE (PF) 1 %-1:200000 IJ SOLN
30.0000 mL | Freq: Once | INTRAMUSCULAR | Status: DC
Start: 2016-04-18 — End: 2016-04-19

## 2016-04-18 MED ORDER — TRAMADOL HCL 50 MG PO TABS: 50.0000 mg | ORAL_TABLET | Freq: Two times a day (BID) | ORAL | 0 refills | Status: DC

## 2016-04-18 MED ORDER — SULFAMETHOXAZOLE-TRIMETHOPRIM 800-160 MG PO TABS
1.0000 | ORAL_TABLET | Freq: Once | ORAL | Status: AC
  Administered 2016-04-18: 1 via ORAL
  Filled 2016-04-18: qty 1

## 2016-04-18 NOTE — ED Triage Notes (Signed)
Pt has hx of recurrent abscesses. Pt has had this one for greater than 1 week. Pt states unable to get appointment w/ PCP. Pt states usually has I&D of abscess. Pt drove self to ED, today. In no acute distress, ambulatory to triage w/o difficulty. Pt denies drainage at this time.

## 2016-04-18 NOTE — Discharge Instructions (Signed)
You have been treated for a skin abscess with a procedure to drain the pus from the wound. Keep the wound clean, dry, and covered. You may apply warm compresses, over the dressing to promote healing. Take the antibiotic as directed and the pain medicine as needed. Follow-up with your provider in 3 days for packing removal.

## 2016-04-19 NOTE — ED Provider Notes (Signed)
[facility] Emergency Department Provider Note ____________________________________________  Time seen: 2107  I have reviewed the triage vital signs and the nursing notes.  HISTORY  Chief Complaint  Abscess  HPI Ashley Stanton is a 31 y.o. female presents to the ED for evaluation and management of a left upper arm abscess. She reports the area has been present to 3-4 days. She has been applying warm compresses, OTC topical ointments, and fatback bacon. She denies fever, chills, or spontaneous drainage. She reports recurrent abscess formation, requiring local incision and drainage.   Past Medical History:  Diagnosis Date  . Allergy    SEASONAL  . Bell's palsy 05/08/2014  . Bell's palsy 2016  . Hidradenitis axillaris     Patient Active Problem List   Diagnosis Date Noted  . Insomnia 03/12/2016  . Tinea pedis 03/12/2016  . Obesity 10/13/2015  . 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 CESAREAN SECTION  02/11/2009  . CESAREAN SECTION N/A 06/17/2015   Procedure: REPEAT CESAREAN SECTION;  Surgeon: Conard Novak, MD;  Location: [facility] ORS;  Service: Obstetrics;  Laterality: N/A;    Prior to Admission medications   Medication Sig Start Date End Date Taking? Authorizing Provider  clotrimazole-betamethasone (LOTRISONE) cream Apply 1 application topically 2 (two) times daily. 10/12/15   Salley Scarlet, MD  etonogestrel (NEXPLANON) 68 MG IMPL implant 1 each by Subdermal route once.    Historical Provider, MD  sulfamethoxazole-trimethoprim (BACTRIM DS,SEPTRA DS) 800-160 MG tablet Take 1 tablet by mouth 2 (two) times daily. 04/18/16   Manasseh Pittsley V Bacon Elianys Conry, PA-C  terbinafine (LAMISIL) 250 MG tablet Take 1 tablet (250 mg total) by mouth daily. 02/08/16   Donita Brooks, MD  traMADol (ULTRAM) 50 MG tablet Take 1 tablet (50 mg total) by mouth 2 (two) times daily. 04/18/16   Etheridge Geil V Bacon Mahira Petras, PA-C     Allergies Citrus  Family History  Problem Relation Age of Onset  . Miscarriages / [place] Mother   . Alcohol abuse Father   . Asthma Father   . Diabetes Father   . Hypertension Maternal Grandmother   . Stroke Maternal Grandmother   . Heart disease Maternal Grandfather     Social History Social History  Substance Use Topics  . Smoking status: Current Every Day Smoker    Packs/day: 0.50    Types: Cigarettes  . Smokeless tobacco: Never Used  . Alcohol use 1.2 oz/week    2 Glasses of wine per week    Review of Systems  Constitutional: Negative for fever. Cardiovascular: Negative for chest pain. Respiratory: Negative for shortness of breath. Skin: Negative for rash. LUE abscess as above.  Neurological: Negative for headaches, focal weakness or numbness. ____________________________________________  PHYSICAL EXAM:  VITAL SIGNS: ED Triage Vitals  Enc Vitals Group     BP 04/18/16 1908 121/81     Pulse Rate 04/18/16 1908 (!) 106     Resp 04/18/16 1908 20     Temp 04/18/16 1908 99 F (37.2 C)     Temp Source 04/18/16 1908 Oral     SpO2 04/18/16 1908 99 %     Weight 04/18/16 1909 225 lb (102.1 kg)     Height 04/18/16 1909 5\' 3"  (1.6 m)     Head Circumference --      Peak Flow --      Pain Score 04/18/16 1909 7     Pain Loc --  Pain Edu? --      Excl. in GC? --     Constitutional: Alert and oriented. Well appearing and in no distress. Head: Normocephalic and atraumatic. Cardiovascular: Normal rate, regular rhythm. Normal distal pulses. Respiratory: Normal respiratory effort. No wheezes/rales/rhonchi. Musculoskeletal: Nontender with normal range of motion in all extremities.  Neurologic:  Normal gait without ataxia. Normal speech and language. No gross focal neurologic deficits are appreciated. Skin:  Skin is warm, dry and intact. No rash noted. Patient with a fluctuant, focal area of inflammation and abscess to the underside of the upper left arm.   ____________________________________________  PROCEDURES  Bactrim DS 1 PO  INCISION AND DRAINAGE Performed by: Lissa HoardMenshew, Bartley Vuolo V Bacon Consent: Verbal consent obtained. Risks and benefits: risks, benefits and alternatives were discussed Type: abscess  Body area: Left upper arm  Anesthesia: local infiltration  Incision was made with a scalpel.  Local anesthetic: lidocaine 2% w/ epinephrine  Anesthetic total: 1.7 ml  Complexity: complex Blunt dissection to break up loculations  Drainage: purulent  Drainage amount: moderate  Packing material: 1/4 in iodoform gauze  Patient tolerance: Patient tolerated the procedure well with no immediate complications. ____________________________________________  INITIAL IMPRESSION / ASSESSMENT AND PLAN / ED COURSE  Patient with a superficial abscess formation to the left upper arm, s/p incision and drainage. The wound is packed and dressed. The patient is discharged with a prescription for Ultram and Bactrim DS. She is advised to follow-up with her provider in 3 days for wound check. She should be evaluation by dermatology for recurrent symptoms.  ____________________________________________  FINAL CLINICAL IMPRESSION(S) / ED DIAGNOSES  Final diagnoses:  Abscess  Encounter for incision and drainage procedure     Lissa HoardJenise V Bacon Aleeya Veitch, PA-C 04/19/16 1739    Sharyn CreamerMark Quale, MD 05/02/16 [phone]

## 2016-04-24 ENCOUNTER — Encounter: Payer: Self-pay | Admitting: Family Medicine

## 2016-05-21 ENCOUNTER — Emergency Department
Admission: EM | Admit: 2016-05-21 | Discharge: 2016-05-21 | Disposition: A | Discharge status: Home / Self Care | Payer: No Typology Code available for payment source | Attending: Emergency Medicine | Admitting: Emergency Medicine

## 2016-05-21 ENCOUNTER — Encounter: Payer: Self-pay | Admitting: Emergency Medicine

## 2016-05-21 ENCOUNTER — Emergency Department: Payer: No Typology Code available for payment source

## 2016-05-21 DIAGNOSIS — Y92410 Unspecified street and highway as the place of occurrence of the external cause: Secondary | ICD-10-CM | POA: Insufficient documentation

## 2016-05-21 DIAGNOSIS — Z79899 Other long term (current) drug therapy: Secondary | ICD-10-CM | POA: Insufficient documentation

## 2016-05-21 DIAGNOSIS — F17210 Nicotine dependence, cigarettes, uncomplicated: Secondary | ICD-10-CM | POA: Diagnosis not present

## 2016-05-21 DIAGNOSIS — M7918 Myalgia, other site: Secondary | ICD-10-CM

## 2016-05-21 DIAGNOSIS — S3992XA Unspecified injury of lower back, initial encounter: Secondary | ICD-10-CM | POA: Diagnosis present

## 2016-05-21 DIAGNOSIS — Y9389 Activity, other specified: Secondary | ICD-10-CM | POA: Insufficient documentation

## 2016-05-21 DIAGNOSIS — M791 Myalgia: Secondary | ICD-10-CM | POA: Insufficient documentation

## 2016-05-21 DIAGNOSIS — Y999 Unspecified external cause status: Secondary | ICD-10-CM | POA: Insufficient documentation

## 2016-05-21 MED ORDER — KETOROLAC TROMETHAMINE 30 MG/ML IJ SOLN: 30.0000 mg | Freq: Once | INTRAMUSCULAR | Status: DC

## 2016-05-21 MED ORDER — HYDROCODONE-ACETAMINOPHEN 5-325 MG PO TABS: 1.0000 | ORAL_TABLET | Freq: Four times a day (QID) | ORAL | 0 refills | Status: DC | PRN

## 2016-05-21 MED ORDER — KETOROLAC TROMETHAMINE 30 MG/ML IJ SOLN
30.0000 mg | Freq: Once | INTRAMUSCULAR | Status: AC
  Administered 2016-05-21: 30 mg via INTRAMUSCULAR

## 2016-05-21 MED ORDER — KETOROLAC TROMETHAMINE 30 MG/ML IJ SOLN
INTRAMUSCULAR | Status: AC
  Administered 2016-05-21: 30 mg via INTRAMUSCULAR
  Filled 2016-05-21: qty 1

## 2016-05-21 NOTE — ED Notes (Signed)
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
## 2016-05-27 ENCOUNTER — Ambulatory Visit: Payer: Managed Care, Other (non HMO) | Admitting: Family Medicine

## 2016-05-30 ENCOUNTER — Encounter: Payer: Self-pay | Admitting: Family Medicine

## 2016-05-30 ENCOUNTER — Ambulatory Visit (INDEPENDENT_AMBULATORY_CARE_PROVIDER_SITE_OTHER): Payer: Self-pay | Admitting: Family Medicine

## 2016-05-30 DIAGNOSIS — M940 Chondrocostal junction syndrome [Tietze]: Secondary | ICD-10-CM

## 2016-05-30 DIAGNOSIS — S46911D Strain of unspecified muscle, fascia and tendon at shoulder and upper arm level, right arm, subsequent encounter: Secondary | ICD-10-CM

## 2016-05-30 DIAGNOSIS — L732 Hidradenitis suppurativa: Secondary | ICD-10-CM

## 2016-05-30 DIAGNOSIS — R000 Tachycardia, unspecified: Secondary | ICD-10-CM

## 2016-05-30 DIAGNOSIS — L02419 Cutaneous abscess of limb, unspecified: Secondary | ICD-10-CM

## 2016-05-30 DIAGNOSIS — S39012D Strain of muscle, fascia and tendon of lower back, subsequent encounter: Secondary | ICD-10-CM

## 2016-05-30 MED ORDER — IBUPROFEN 600 MG PO TABS: 600.0000 mg | ORAL_TABLET | Freq: Three times a day (TID) | ORAL | 0 refills | Status: DC | PRN

## 2016-05-30 MED ORDER — CYCLOBENZAPRINE HCL 5 MG PO TABS: 5.0000 mg | ORAL_TABLET | Freq: Two times a day (BID) | ORAL | 1 refills | Status: DC | PRN

## 2016-05-30 MED ORDER — DOXYCYCLINE HYCLATE 100 MG PO TABS: 100.0000 mg | ORAL_TABLET | Freq: Two times a day (BID) | ORAL | 0 refills | Status: DC

## 2016-05-30 NOTE — Addendum Note (Signed)
Addended by: Milinda Antis F on: 05/30/2016 04:44 PM   Modules accepted: Orders

## 2016-05-30 NOTE — Patient Instructions (Signed)
Take ibuprofen for pain/inflammation Flexeril muscle relaxer Take antibiotics as prescribed Call if you are not improving We will look into surgeon  F/U as needed
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
## 2016-06-02 ENCOUNTER — Encounter: Payer: Self-pay | Admitting: Family Medicine

## 2016-07-22 ENCOUNTER — Ambulatory Visit (INDEPENDENT_AMBULATORY_CARE_PROVIDER_SITE_OTHER): Payer: Self-pay | Admitting: Family Medicine

## 2016-07-22 ENCOUNTER — Encounter: Payer: Self-pay | Admitting: Family Medicine

## 2016-07-22 VITALS — BP 128/78 | HR 82 | Temp 98.7°F | Resp 14 | Ht 62.0 in | Wt 235.0 lb

## 2016-07-22 DIAGNOSIS — L732 Hidradenitis suppurativa: Secondary | ICD-10-CM

## 2016-07-22 DIAGNOSIS — L02439 Carbuncle of limb, unspecified: Secondary | ICD-10-CM

## 2016-07-22 DIAGNOSIS — L02429 Furuncle of limb, unspecified: Secondary | ICD-10-CM

## 2016-07-22 MED ORDER — DOXYCYCLINE HYCLATE 100 MG PO CAPS: 100.0000 mg | ORAL_CAPSULE | Freq: Two times a day (BID) | ORAL | 0 refills | Status: DC

## 2016-07-22 MED ORDER — FLUCONAZOLE 150 MG PO TABS: ORAL_TABLET | ORAL | 1 refills | Status: DC

## 2016-07-22 MED ORDER — HYDROCODONE-ACETAMINOPHEN 5-325 MG PO TABS: 1.0000 | ORAL_TABLET | Freq: Four times a day (QID) | ORAL | 0 refills | Status: DC | PRN

## 2016-07-22 NOTE — Patient Instructions (Signed)
Take antibiotics as prescribed Take yeast medication Pull out 1-2 inches of packing every 48 hours  F/U as needed

## 2016-07-22 NOTE — Progress Notes (Signed)
   Subjective:    Patient ID: Ashley Stanton, female    DOB: 04/16/1985, 31 y.o.   MRN: [id number]  Patient presents for L Axilla Abscess (has not beens een by surgeon at this time)  Patient here with recurrent abscess in her left axilla. She has bilateral hidradenitis in the surgical resection however she is uninsured. She was seen by the surgeon a few weeks ago she had one in her right axilla treated with amoxicillin but she did not tolerate the antibiotic because severe yeast infection therefore she discontinued. One in her left axilla is actually more into the arm area which she's had previously and has had this opened up and drained before. This isn't present for the past 3-4 days. She's not had any fever no chills.   Review Of Systems:  GEN- denies fatigue, fever, weight loss,weakness, recent illness HEENT- denies eye drainage, change in vision, nasal discharge, CVS- denies chest pain, palpitations RESP- denies SOB, cough, wheeze ABD- denies N/V, change in stools, abd pain GU- denies dysuria, hematuria, dribbling, incontinence MSK- denies joint pain, muscle aches, injury Neuro- denies headache, dizziness, syncope, seizure activity       Objective:    BP 128/78   Pulse 82   Temp 98.7 F (37.1 C) (Oral)   Resp 14   Ht 5\' 2"  (1.575 m)   Wt 235 lb (106.6 kg)   SpO2 98%   BMI 42.98 kg/m  GEN- NAD, alert and oriented x3 Skin- bilat axilla hidrandeitis with pits and chronic induration, left axilla mild swelling inner arm left side 2-3 inches from axilla large golf ball size abscess with extended indurtation  2 inches around arm, TTP, minmal erythema    Procedure- Incision and Drainage Procedure explained to patient questions answered benefits and risks discussed verbal  consent obtained. Antiseptic-Betadine Anesthesia-lidocaine1% with Epi  Incision performed large amount of pus expressed Iodorm approx 6-7 inches placed  Minimal blood loss Patient tolerated procedure  well Bandage applied      Assessment & Plan:      Problem List Items Addressed This Visit    None    Visit Diagnoses    Boil, arm    -  Primary   Recurrent abscess, needs surgical resection, but unable to afford right now, I and D done, pt very familiar with removal of packing every 48hours. Given Doxycycline and Diflucan due to yeast  infection. She's also given Norco short-term for the pain. We discussed her dietary changes as well as her increasing weight as well. She has been checked for diabetes mellitus in 2017.    Relevant Medications   fluconazole (DIFLUCAN) 150 MG tablet   Left axillary hidradenitis       Relevant Medications   fluconazole (DIFLUCAN) 150 MG tablet      Note: This dictation was prepared with Dragon dictation along with smaller phrase technology. Any transcriptional errors that result from this process are unintentional.

## 2016-07-23 ENCOUNTER — Encounter: Payer: Self-pay | Admitting: *Deleted

## 2016-08-01 ENCOUNTER — Ambulatory Visit (INDEPENDENT_AMBULATORY_CARE_PROVIDER_SITE_OTHER): Payer: Medicaid Other | Admitting: Obstetrics and Gynecology

## 2016-08-01 ENCOUNTER — Encounter: Payer: Self-pay | Admitting: Obstetrics and Gynecology

## 2016-08-01 VITALS — BP 126/74 | Ht 63.0 in | Wt 236.0 lb

## 2016-08-01 DIAGNOSIS — Z131 Encounter for screening for diabetes mellitus: Secondary | ICD-10-CM

## 2016-08-01 DIAGNOSIS — Z01419 Encounter for gynecological examination (general) (routine) without abnormal findings: Secondary | ICD-10-CM | POA: Diagnosis not present

## 2016-08-01 DIAGNOSIS — Z124 Encounter for screening for malignant neoplasm of cervix: Secondary | ICD-10-CM | POA: Diagnosis not present

## 2016-08-01 DIAGNOSIS — Z30011 Encounter for initial prescription of contraceptive pills: Secondary | ICD-10-CM | POA: Diagnosis not present

## 2016-08-01 DIAGNOSIS — Z113 Encounter for screening for infections with a predominantly sexual mode of transmission: Secondary | ICD-10-CM | POA: Diagnosis not present

## 2016-08-01 DIAGNOSIS — Z0000 Encounter for general adult medical examination without abnormal findings: Secondary | ICD-10-CM

## 2016-08-01 DIAGNOSIS — Z3046 Encounter for surveillance of implantable subdermal contraceptive: Secondary | ICD-10-CM

## 2016-08-01 MED ORDER — NORGESTIMATE-ETH ESTRADIOL 0.25-35 MG-MCG PO TABS: 1.0000 | ORAL_TABLET | Freq: Every day | ORAL | 4 refills | Status: DC

## 2016-08-01 NOTE — Progress Notes (Signed)
Gynecology Annual Exam  PCP: Salley Scarlet, MD  Chief Complaint  Patient presents with  . Annual Exam  . nexplanon removal    History of Present Illness:  Ms. Ashley Stanton is a 31 y.o. [id number] who LMP was No LMP recorded. Patient has had an implant., presents today for her annual examination.  Her menses are absent due to Nexplanon  She is single partner, contraception - Nexplanon.  Last Pap: one year ago, normal (no HPV performed) Hx of STDs: trichomonas 1 year ago, treated  Last mammogram: none There is no FH of breast cancer. There is no FH of ovarian cancer. The patient does not do self-breast exams.  Tobacco use: daily smoker. Alcohol use: social drinker Exercise: not active  The patient wears seatbelts: yes.      Past Medical History:  Diagnosis Date  . Allergy    SEASONAL  . Bell's palsy 05/08/2014  . Bell's palsy 2016  . Hidradenitis axillaris     Past Surgical History:  Procedure Laterality Date  . CESAREAN SECTION  02/11/2009  . CESAREAN SECTION N/A 06/17/2015   Procedure: REPEAT CESAREAN SECTION;  Surgeon: Conard Novak, MD;  Location: [facility] ORS;  Service: Obstetrics;  Laterality: N/A;    Prior to Admission medications   Medication Sig Start Date End Date Taking? Authorizing Provider  doxycycline (VIBRAMYCIN) 100 MG capsule Take 1 capsule (100 mg total) by mouth 2 (two) times daily. 07/22/16  Yes [place], Velna Hatchet, MD  etonogestrel (NEXPLANON) 68 MG IMPL implant 1 each by Subdermal route once.   Yes [provider]  fluconazole (DIFLUCAN) 150 MG tablet Take 1 tablet repeat in 3 days Patient not taking: Reported on 08/01/2016 07/22/16   Salley Scarlet, MD  ibuprofen (ADVIL,MOTRIN) 600 MG tablet Take 1 tablet (600 mg total) by mouth every 8 (eight) hours as needed. Patient not taking: Reported on 08/01/2016 05/30/16   Salley Scarlet, MD    Allergies  Allergen Reactions  . Citrus     Gynecologic History: No LMP recorded. Patient has had  an implant.  Obstetric History: [id number]  Social History   Social History  . Marital status: Single    Spouse name: N/A  . Number of children: N/A  . Years of education: N/A   Occupational History  . Not on file.   Social History Main Topics  . Smoking status: Current Every Day Smoker    Packs/day: 0.50    Types: Cigarettes  . Smokeless tobacco: Never Used  . Alcohol use 1.2 oz/week    2 Glasses of wine per week  . Drug use: No  . Sexual activity: Yes    Birth control/ protection: Condom, Implant   Other Topics Concern  . Not on file   Social History Narrative  . No narrative on file    Family History  Problem Relation Age of Onset  . Miscarriages / [place] Mother   . Alcohol abuse Father   . Asthma Father   . Diabetes Father   . Hypertension Maternal Grandmother   . Stroke Maternal Grandmother   . Heart disease Maternal Grandfather     Review of Systems  Constitutional: Negative.   HENT: Negative.   Eyes: Negative.   Respiratory: Negative.   Cardiovascular: Negative.   Gastrointestinal: Negative.   Genitourinary: Negative.   Musculoskeletal: Negative.   Skin: Negative.   Neurological: Negative.   Psychiatric/Behavioral: Negative.      Physical Exam BP 126/74   Ht 5'  3" (1.6 m)   Wt 236 lb (107 kg)   BMI 41.81 kg/m    Physical Exam  Constitutional: She is oriented to person, place, and time. She appears well-developed and well-nourished. No distress.  Genitourinary: Vagina normal and uterus normal. Pelvic exam was performed with patient supine. There is no rash, tenderness, lesion or injury on the right labia. There is no rash, tenderness, lesion or injury on the left labia. Vagina exhibits no lesion. No bleeding in the vagina. No signs of injury around the vagina. Right adnexum does not display mass, does not display tenderness and does not display fullness. Left adnexum does not display mass, does not display tenderness and does not display  fullness. Cervix does not exhibit motion tenderness, lesion or polyp.   Uterus is mobile and anteverted. Uterus is not enlarged, tender, exhibiting a mass or irregular (is regular).  Genitourinary Comments: Pelvic limited by body habitus.  HENT:  Head: Normocephalic and atraumatic.  Eyes: EOM are normal. No scleral icterus.  Neck: Normal range of motion. Neck supple.  Cardiovascular: Normal rate and regular rhythm.   Pulmonary/Chest: Effort normal and breath sounds normal. No respiratory distress. She has no wheezes. She has no rales.  Abdominal: Soft. Bowel sounds are normal. She exhibits no distension and no mass. There is no tenderness. There is no rebound and no guarding.  Musculoskeletal: Normal range of motion. She exhibits no edema.  Neurological: She is alert and oriented to person, place, and time. No cranial nerve deficit.  Skin: Skin is warm and dry. Rash (acanthosis nigricans around neck) noted. No erythema.  Psychiatric: She has a normal mood and affect. Her behavior is normal. Judgment normal.     GYNECOLOGY PROCEDURE NOTE  Implanon removal discussed in detail.  Risks of infection, bleeding, nerve injury all reviewed.  Patient understands risks and desires to proceed.  Verbal consent obtained.  Patient is certain she wants the implanon removed.  All questions answered.  Procedure: Patient placed in dorsal supine with left arm above head, elbow flexed at 90 degrees, arm resting on examination table.  Implanon identified without problems.  Betadine scrub x3.  1 ml of 1% lidocaine injected under implanon device without problems.  Sterile gloves applied.  Small 0.5cm incision made at distal tip of implanon device with 11 blade scalpel.  Implanon brought to incision and grasped with a small kelly clamp.  Implanon removed intact without problems.  Pressure applied to incision.  Hemostasis obtained.  Steri-strips applied, followed by bandage and compression dressing.  Patient tolerated  procedure well.  No complications.    Female chaperone present for pelvic and breast  portions of the physical exam  Results: AUDIT Questionnaire (screen for alcoholism): 1 PHQ-9: 4   Assessment: 31 y.o. [id number] female here for routine annual gynecologic examination.  Plan: Problem List Items Addressed This Visit    None    Visit Diagnoses    Women's annual routine gynecological examination    -  Primary   Relevant Medications   norgestimate-ethinyl estradiol (ORTHO-CYCLEN,SPRINTEC,PREVIFEM) 0.25-35 MG-MCG tablet   Other Relevant Orders   IGP,CtNgTv,Apt HPV,rfx16/18,45   Hgb A1c w/o eAG   Pap smear for cervical cancer screening       Relevant Orders   IGP,CtNgTv,Apt HPV,rfx16/18,45   Screen for STD (sexually transmitted disease)       Relevant Orders   IGP,CtNgTv,Apt HPV,rfx16/18,45   Screening for diabetes mellitus       Relevant Orders   Hgb A1c w/o eAG  Encounter for initial prescription of contraceptive pills       Relevant Medications   norgestimate-ethinyl estradiol (ORTHO-CYCLEN,SPRINTEC,PREVIFEM) 0.25-35 MG-MCG tablet   Nexplanon removal          Screening: -- Blood pressure screen normal -- Colonoscopy - not due -- Mammogram - not due -- Weight screening: obese: discussed management options, including lifestyle, dietary, and exercise. -- Depression screening negative (PHQ-9) -- Nutrition: normal -- cholesterol screening: per PCP -- osteoporosis screening: not due -- tobacco screening: using: discussed quitting using the 5 A's -- alcohol screening: AUDIT questionnaire indicates low-risk usage. -- family history of breast cancer screening: done. not at high risk. -- no evidence of domestic violence or intimate partner violence. -- STD screening: gonorrhea/chlamydia NAAT collected -- pap smear collected per ASCCP guidelines -- HPV vaccination series: not eligilbe  Thomasene Mohair, MD 08/01/2016 11:51 AM

## 2016-08-02 LAB — HGB A1C W/O EAG: HEMOGLOBIN A1C: 5.5 % (ref 4.8–5.6)

## 2016-08-06 LAB — IGP,CTNGTV,APT HPV,RFX16/18,45
Chlamydia, Nuc. Acid Amp: NEGATIVE
Gonococcus, Nuc. Acid Amp: NEGATIVE
HPV APTIMA: NEGATIVE
PAP SMEAR COMMENT: 0
TRICH VAG BY NAA: NEGATIVE

## 2016-08-12 ENCOUNTER — Encounter: Payer: Self-pay | Admitting: Obstetrics and Gynecology

## 2016-10-15 ENCOUNTER — Encounter: Payer: Self-pay | Admitting: Family Medicine

## 2016-10-21 ENCOUNTER — Other Ambulatory Visit: Payer: Self-pay | Admitting: Family Medicine

## 2016-10-21 MED ORDER — DOXYCYCLINE HYCLATE 100 MG PO CAPS: 100.0000 mg | ORAL_CAPSULE | Freq: Two times a day (BID) | ORAL | 0 refills | Status: DC

## 2016-10-21 MED ORDER — FLUCONAZOLE 150 MG PO TABS: ORAL_TABLET | ORAL | 1 refills | Status: DC

## 2016-11-13 ENCOUNTER — Encounter: Payer: Self-pay | Admitting: Family Medicine

## 2016-12-12 ENCOUNTER — Encounter: Payer: Self-pay | Admitting: Family Medicine

## 2017-01-01 ENCOUNTER — Other Ambulatory Visit: Payer: Self-pay | Admitting: Family Medicine

## 2017-01-02 NOTE — Telephone Encounter (Signed)
Patient has appointment at 01/12/2017.

## 2017-01-02 NOTE — Telephone Encounter (Signed)
Ok to refill 

## 2017-01-02 NOTE — Telephone Encounter (Signed)
Per MD, patient needs to be seen sooner than 01/12/2017.  Call placed to patient. New appointment scheduled for 01/05/2017.

## 2017-01-02 NOTE — Telephone Encounter (Signed)
Get appointment

## 2017-01-05 ENCOUNTER — Ambulatory Visit: Payer: BC Managed Care – PPO | Admitting: Family Medicine

## 2017-01-05 ENCOUNTER — Encounter: Payer: Self-pay | Admitting: Family Medicine

## 2017-01-05 ENCOUNTER — Other Ambulatory Visit: Payer: Self-pay

## 2017-01-05 VITALS — BP 128/82 | HR 82 | Temp 98.7°F | Resp 16 | Ht 63.0 in | Wt 244.0 lb

## 2017-01-05 DIAGNOSIS — Z6841 Body Mass Index (BMI) 40.0 and over, adult: Secondary | ICD-10-CM | POA: Diagnosis not present

## 2017-01-05 DIAGNOSIS — L732 Hidradenitis suppurativa: Secondary | ICD-10-CM

## 2017-01-05 DIAGNOSIS — L0291 Cutaneous abscess, unspecified: Secondary | ICD-10-CM

## 2017-01-05 MED ORDER — DOXYCYCLINE HYCLATE 100 MG PO CAPS: 100.0000 mg | ORAL_CAPSULE | Freq: Two times a day (BID) | ORAL | 0 refills | Status: DC

## 2017-01-05 NOTE — Patient Instructions (Signed)
Take antibiotics as prescribed Referral to Surgeon F/U physical

## 2017-01-05 NOTE — Progress Notes (Signed)
   Subjective:    Patient ID: Ashley Stanton, female    DOB: 12/16/1985, 31 y.o.   MRN: [id number]  Patient presents for Abscess (L Axilla/ pubic mound) and Referral (general surgery)   Recurrent in Axilla, has hidraentitis-[facility]    Now has abscess beneath pannus, came up last week, has been getting larger feels like it is rubbing when she walks , using neopsporin, using hot compresses, this AM looked like it popped   No fever or chills    Nexplanon has been removed, working on wieght loss  Bariatric clinic in [place] , was prescribed phentermine has not been taking consistently      Review Of Systems:  GEN- denies fatigue, fever, weight loss,weakness, recent illness HEENT- denies eye drainage, change in vision, nasal discharge, CVS- denies chest pain, palpitations RESP- denies SOB, cough, wheeze ABD- denies N/V, change in stools, abd pain GU- denies dysuria, hematuria, dribbling, incontinence MSK- denies joint pain, muscle aches, injury Neuro- denies headache, dizziness, syncope, seizure activity       Objective:    BP 128/82   Pulse 82   Temp 98.7 F (37.1 C) (Oral)   Resp 16   Ht 5\' 3"  (1.6 m)   Wt 244 lb (110.7 kg)   SpO2 98%   BMI 43.22 kg/m  GEN- NAD, alert and oriented x3 CVS- RRR, no murmur RESP-CTAB ABD-NABS,soft,NT,ND Skin- hidrandeitis bilat axilla, mild swelling right axilla no fluctance, minimal erythema, right upper mons pubic region, small abscess + induration, mild fluctance, peeling skin around EXT- No edema Pulses- Radial 2+  Procedure- Incision and Drainage Procedure explained to patient questions answered benefits and risks discussed written consent obtained. 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 Any transcriptional errors that result from this process are unintentional.

## 2017-01-06 ENCOUNTER — Encounter: Payer: Self-pay | Admitting: Family Medicine

## 2017-01-06 MED ORDER — TRAMADOL HCL 50 MG PO TABS: 50.0000 mg | ORAL_TABLET | Freq: Three times a day (TID) | ORAL | 0 refills | Status: DC | PRN

## 2017-01-06 NOTE — Assessment & Plan Note (Signed)
Referral to surgeon now she has insurance, has been told before needs surgical intervention

## 2017-01-08 ENCOUNTER — Ambulatory Visit: Payer: BC Managed Care – PPO | Admitting: Surgery

## 2017-01-12 ENCOUNTER — Ambulatory Visit: Payer: Self-pay | Admitting: Family Medicine

## 2017-01-26 ENCOUNTER — Ambulatory Visit: Payer: BC Managed Care – PPO | Admitting: Obstetrics and Gynecology

## 2017-02-05 ENCOUNTER — Telehealth: Payer: Self-pay | Admitting: Obstetrics and Gynecology

## 2017-02-05 ENCOUNTER — Encounter: Payer: Self-pay | Admitting: Obstetrics and Gynecology

## 2017-02-05 ENCOUNTER — Ambulatory Visit (INDEPENDENT_AMBULATORY_CARE_PROVIDER_SITE_OTHER): Payer: BC Managed Care – PPO | Admitting: Obstetrics and Gynecology

## 2017-02-05 VITALS — BP 124/74 | Ht 63.0 in | Wt 250.0 lb

## 2017-02-05 DIAGNOSIS — Z308 Encounter for other contraceptive management: Secondary | ICD-10-CM | POA: Diagnosis not present

## 2017-02-05 NOTE — Progress Notes (Signed)
Obstetrics & Gynecology Office Visit   Chief Complaint  Patient presents with  . Contraception  consultation  History of Present Illness: Patient is a 32 y.o. [id number] presenting for contraception consult.  She is currently on OCP (estrogen/progesterone) and desiring to start Nexplanon.  She has a past medical history significant for no contraindication to estrogen.  She specifically denies a history of migraine with aura, chronic hypertension, history of DVT/PE and smoking.  Reported Patient's last menstrual period was 01/16/2017. She has tried nexplanon in the past, but thought it might be contributing to weight gain.  She had her Nexplanon removed in June 2018. However, she now realizes that the device has not been a contributor to her weight gain.  She would like to discuss birth control options, but is strongly considering having her Nexplanon replaced.  Past Medical History:  Diagnosis Date  . Allergy    SEASONAL  . Bell's palsy 05/08/2014  . Bell's palsy 2016  . Hidradenitis axillaris     Past Surgical History:  Procedure Laterality Date  . CESAREAN SECTION  02/11/2009  . CESAREAN SECTION N/A 06/17/2015   Procedure: REPEAT CESAREAN SECTION;  Surgeon: Conard Novak, MD;  Location: [facility] ORS;  Service: Obstetrics;  Laterality: N/A;    Gynecologic History: Patient's last menstrual period was 01/16/2017.  Obstetric History: [id number]  Family History  Problem Relation Age of Onset  . Miscarriages / [place] Mother   . Alcohol abuse Father   . Asthma Father   . Diabetes Father   . Hypertension Maternal Grandmother   . Stroke Maternal Grandmother   . Heart disease Maternal Grandfather     Social History   Socioeconomic History  . Marital status: Single    Spouse name: Not on file  . Number of children: Not on file  . Years of education: Not on file  . Highest education level: Not on file  Social Needs  . Financial resource strain: Not on file  . Food insecurity -  worry: Not on file  . Food insecurity - inability: Not on file  . Transportation needs - medical: Not on file  . Transportation needs - non-medical: Not on file  Occupational History  . Not on file  Tobacco Use  . Smoking status: Current Every Day Smoker    Packs/day: 0.50    Types: Cigarettes  . Smokeless tobacco: Never Used  Substance and Sexual Activity  . Alcohol use: Yes    Alcohol/week: 1.2 oz    Types: 2 Glasses of wine per week  . Drug use: No  . Sexual activity: Yes    Birth control/protection: Condom, Implant  Other Topics Concern  . Not on file  Social History Narrative  . Not on file    Allergies  Allergen Reactions  . Citrus     Prior to Admission medications   Medication Sig Start Date End Date Taking? Authorizing Provider  phentermine (ADIPEX-P) 37.5 MG tablet Take 37.5 mg by mouth daily before breakfast.    [provider]    Review of Systems  Constitutional: Negative.   HENT: Negative.   Eyes: Negative.   Respiratory: Negative.   Cardiovascular: Negative.   Gastrointestinal: Negative.   Genitourinary: Negative.   Musculoskeletal: Negative.   Skin: Negative.   Neurological: Negative.   Psychiatric/Behavioral: Negative.      Physical Exam BP 124/74   Ht 5\' 3"  (1.6 m)   Wt 250 lb (113.4 kg)   LMP 01/16/2017   BMI 44.29 kg/m  Patient's last menstrual period was 01/16/2017. Physical Exam  Constitutional: She is oriented to person, place, and time. She appears well-developed and well-nourished. No distress.  HENT:  Head: Normocephalic and atraumatic.  Pulmonary/Chest: No respiratory distress.  Neurological: She is alert and oriented to person, place, and time. No cranial nerve deficit.  Psychiatric: She has a normal mood and affect. Her behavior is normal. Judgment normal.   Assessment: 32 y.o. [id number] female here for  1. Encounter for other contraceptive management      Plan: Problem List Items Addressed This Visit    None      Visit Diagnoses    Encounter for other contraceptive management    -  Primary     Reviewed all forms of birth control options available including abstinence; over the counter/barrier methods; hormonal contraceptive medication including pill, patch, ring, injection,contraceptive implant; hormonal and nonhormonal IUDs; permanent sterilization options including vasectomy and the various tubal sterilization modalities. Risks and benefits reviewed.  Questions were answered.  Patient elects to have Nexplanon placed. She is mid-cycle. So, will have her return to clinic in a few weeks after her next cycle starts and place Nexplanon.  20 minutes spent in face to face discussion with > 50% spent in counseling, management, and coordination of care of her desire for contraception counseling and management.   Thomasene MohairStephen Dawn Kiper, MD 02/05/2017 3:45 PM

## 2017-02-05 NOTE — Telephone Encounter (Signed)
Patient scheduled 1/25 for nexplanon insert with SDJ

## 2017-02-18 ENCOUNTER — Ambulatory Visit: Payer: BC Managed Care – PPO | Admitting: General Surgery

## 2017-02-24 ENCOUNTER — Encounter: Payer: Self-pay | Admitting: Obstetrics and Gynecology

## 2017-02-24 ENCOUNTER — Ambulatory Visit (INDEPENDENT_AMBULATORY_CARE_PROVIDER_SITE_OTHER): Payer: BC Managed Care – PPO | Admitting: Obstetrics and Gynecology

## 2017-02-24 VITALS — BP 118/76 | Ht 63.0 in | Wt 248.0 lb

## 2017-02-24 DIAGNOSIS — Z30017 Encounter for initial prescription of implantable subdermal contraceptive: Secondary | ICD-10-CM

## 2017-02-24 DIAGNOSIS — Z309 Encounter for contraceptive management, unspecified: Secondary | ICD-10-CM | POA: Diagnosis not present

## 2017-02-24 DIAGNOSIS — Z113 Encounter for screening for infections with a predominantly sexual mode of transmission: Secondary | ICD-10-CM

## 2017-02-24 NOTE — Progress Notes (Signed)
     GYNECOLOGY PROCEDURE NOTE  Patient is a 32 y.o. [id number] presenting for Nexplanon insertion as her desires means of contraception.  She provided informed consent, signed copy in the chart, time out was performed. Pregnancy test was negative, with self reported LMP of Patient's last menstrual period was 02/13/2017.    She understands that Nexplanon is a progesterone only therapy, and that patients often patients have irregular and unpredictable vaginal bleeding or amenorrhea. She understands that other side effects are possible related to systemic progesterone, including but not limited to, headaches, breast tenderness, nausea, and irritability. While effective at preventing pregnancy Blankenbeckler acting reversible contraceptives do not prevent transmission of sexually transmitted diseases and use of barrier methods for this purpose was discussed. The placement procedure for Nexplanon was reviewed with the patient in detail including risks of nerve injury, infection, bleeding and injury to other muscles or tendons. She understands that the Nexplanon implant is good for 3 years and needs to be removed at the end of that time.  She understands that Nexplanon is an extremely effective option for contraception, with failure rate of <1%. This information is reviewed today and all questions were answered. Informed consent was obtained, both verbally and written.   The patient is healthy and has no contraindications to Nexplanon use. Urine pregnancy test was performed today and was negative.  Procedure Appropriate time out taken.  Patient placed in dorsal supine with left arm above head, elbow flexed at 90 degrees, arm resting on examination table.  The bicipital grove was palpated and site 8-10cm proximal to the medial epicondyle was indentified . The insertion site was prepped with a two betadine swabs and then injected with 3 cc of 2% lidocaine without epinephrine.  Nexplanon removed form sterile blister  packaging,  Device confirmed in needle, before inserting full length of needle, tenting up the skin as the needle was advance.  The drug eluting rod was then deployed by pulling back the slider per the manufactures recommendation.  The implant was palpable by the clinician as well as the patient.  The insertion site covered dressed with a band aid before applying  a kerlex bandage pressure dressing..Minimal blood loss was noted during the procedure.  The patientt tolerated the procedure well.   She was instructed to wear the bandage for 24 hours, call with any signs of infection.  She was given the Nexplanon card and instructed to have the rod removed in 3 years.  Thomasene MohairStephen Jackson, MD 02/24/2017 11:39 AM

## 2017-02-25 LAB — HIV ANTIBODY (ROUTINE TESTING W REFLEX): HIV SCREEN 4TH GENERATION: NONREACTIVE

## 2017-02-25 LAB — RPR QUALITATIVE: RPR Ser Ql: NONREACTIVE

## 2017-02-25 LAB — HEPATITIS C ANTIBODY: Hep C Virus Ab: <0.1 s/co ratio (ref 0.0–0.9)

## 2017-02-25 LAB — HEPATITIS B SURFACE ANTIBODY,QUALITATIVE: HEP B SURFACE AB, QUAL: REACTIVE

## 2017-02-27 ENCOUNTER — Ambulatory Visit: Payer: BC Managed Care – PPO | Admitting: Obstetrics and Gynecology

## 2017-03-11 ENCOUNTER — Encounter: Payer: Self-pay | Admitting: Family Medicine

## 2017-03-11 MED ORDER — FLUCONAZOLE 150 MG PO TABS: 150.0000 mg | ORAL_TABLET | Freq: Once | ORAL | 0 refills | Status: AC

## 2017-03-31 NOTE — Telephone Encounter (Signed)
Nexplanon received 02/24/17

## 2017-04-01 ENCOUNTER — Encounter: Payer: Self-pay | Admitting: Family Medicine

## 2017-04-01 ENCOUNTER — Ambulatory Visit (INDEPENDENT_AMBULATORY_CARE_PROVIDER_SITE_OTHER): Payer: BC Managed Care – PPO | Admitting: Family Medicine

## 2017-04-01 ENCOUNTER — Other Ambulatory Visit: Payer: Self-pay

## 2017-04-01 VITALS — BP 112/62 | HR 100 | Temp 98.5°F | Resp 14 | Ht 63.0 in | Wt 242.0 lb

## 2017-04-01 DIAGNOSIS — B009 Herpesviral infection, unspecified: Secondary | ICD-10-CM | POA: Diagnosis not present

## 2017-04-01 DIAGNOSIS — L732 Hidradenitis suppurativa: Secondary | ICD-10-CM

## 2017-04-01 DIAGNOSIS — Z6841 Body Mass Index (BMI) 40.0 and over, adult: Secondary | ICD-10-CM

## 2017-04-01 DIAGNOSIS — Z0000 Encounter for general adult medical examination without abnormal findings: Secondary | ICD-10-CM | POA: Diagnosis not present

## 2017-04-01 LAB — CBC WITH DIFFERENTIAL/PLATELET
BASOS ABS: 0 {cells}/uL (ref 0–200)
Basophils Relative: 0 %
EOS ABS: 40 {cells}/uL (ref 15–500)
EOS PCT: 0.9 %
HEMATOCRIT: 39.9 % (ref 35.0–45.0)
HEMOGLOBIN: 13.4 g/dL (ref 11.7–15.5)
Lymphs Abs: 2292 cells/uL (ref 850–3900)
MCH: 28.3 pg (ref 27.0–33.0)
MCHC: 33.6 g/dL (ref 32.0–36.0)
MCV: 84.4 fL (ref 80.0–100.0)
MONOS PCT: 8.4 %
MPV: 9.7 fL (ref 7.5–12.5)
NEUTROS PCT: 38.6 %
Neutro Abs: 1698 cells/uL (ref 1500–7800)
Platelets: 300 10*3/uL (ref 140–400)
RBC: 4.73 10*6/uL (ref 3.80–5.10)
RDW: 12.6 % (ref 11.0–15.0)
Total Lymphocyte: 52.1 %
WBC mixed population: 370 cells/uL (ref 200–950)
WBC: 4.4 10*3/uL (ref 3.8–10.8)

## 2017-04-01 LAB — LIPID PANEL
Cholesterol: 142 mg/dL (ref <200)
HDL: 43 mg/dL — ABNORMAL LOW (ref >50)
LDL CHOLESTEROL (CALC): 85 mg/dL
Non-HDL Cholesterol (Calc): 99 mg/dL (calc) (ref <130)
TRIGLYCERIDES: 67 mg/dL (ref <150)
Total CHOL/HDL Ratio: 3.3 (calc) (ref <5.0)

## 2017-04-01 LAB — COMPREHENSIVE METABOLIC PANEL
AG RATIO: 1.2 (calc) (ref 1.0–2.5)
ALT: 21 U/L (ref 6–29)
AST: 18 U/L (ref 10–30)
Albumin: 4.1 g/dL (ref 3.6–5.1)
Alkaline phosphatase (APISO): 74 U/L (ref 33–115)
BILIRUBIN TOTAL: 0.4 mg/dL (ref 0.2–1.2)
BUN: 8 mg/dL (ref 7–25)
CALCIUM: 9.6 mg/dL (ref 8.6–10.2)
CO2: 22 mmol/L (ref 20–32)
Chloride: 108 mmol/L (ref 98–110)
Creat: 1.09 mg/dL (ref 0.50–1.10)
GLUCOSE: 84 mg/dL (ref 65–99)
Globulin: 3.3 g/dL (calc) (ref 1.9–3.7)
Potassium: 4.4 mmol/L (ref 3.5–5.3)
SODIUM: 138 mmol/L (ref 135–146)
TOTAL PROTEIN: 7.4 g/dL (ref 6.1–8.1)

## 2017-04-01 MED ORDER — DOXYCYCLINE HYCLATE 100 MG PO TABS
100.0000 mg | ORAL_TABLET | Freq: Two times a day (BID) | ORAL | 0 refills | Status: DC
Start: 2017-04-01 — End: 2017-05-20

## 2017-04-01 MED ORDER — VALACYCLOVIR HCL 1 G PO TABS: 1000.0000 mg | ORAL_TABLET | Freq: Two times a day (BID) | ORAL | 1 refills | Status: DC

## 2017-04-01 NOTE — Assessment & Plan Note (Signed)
Dietary changes as well as the diet.  Recommend that she not do anything to restricted that she cannot follow through with Kassner-term.  Okay to utilize protein shakes

## 2017-04-01 NOTE — Patient Instructions (Signed)
F/U 1 year for Physical  

## 2017-04-01 NOTE — Assessment & Plan Note (Signed)
Recurrent problems with hidradenitis.  I have given her a prescription of doxycycline to have on hand.  She will follow-up with her surgeon when she finds a time that she can have the surgery done.  She will also asked him about doing just one arm at a time

## 2017-04-01 NOTE — Progress Notes (Signed)
   Subjective:    Patient ID: Ashley InchesSharda A Wince, female    DOB: 12/23/1985, 32 y.o.   MRN: [id number]  Patient presents for CPE (is fasting)   Pt here for CPE   PAP Smear UTD- PAP Smear UTD, GYN Dr. Jean RosenthalJackson    Has Nexplanon for contraception    Hidradentis- had consult with surgeon recommends having surgery done on both arms at one time however this restriction for work as well as taking care of her children.  She does continue to have some small outbreaks.  She has not been on antibiotics for a couple of months   Had HSV outbreak used zinc topical during outbreak    STD screen was negative recently at GYN  She has 2 children working part time     Obesity- tried [employer] diet, it was very restrictive to her she is trying to incorporate some of the concepts to help her lose weight.      Review Of Systems:  GEN- denies fatigue, fever, weight loss,weakness, recent illness HEENT- denies eye drainage, change in vision, nasal discharge, CVS- denies chest pain, palpitations RESP- denies SOB, cough, wheeze ABD- denies N/V, change in stools, abd pain GU- denies dysuria, hematuria, dribbling, incontinence MSK- denies joint pain, muscle aches, injury Neuro- denies headache, dizziness, syncope, seizure activity       Objective:    BP 112/62   Pulse 100   Temp 98.5 F (36.9 C) (Oral)   Resp 14   Ht 5\' 3"  (1.6 m)   Wt 242 lb (109.8 kg)   SpO2 98%   BMI 42.87 kg/m  GEN- NAD, alert and oriented x3 HEENT- PERRL, EOMI, non injected sclera, pink conjunctiva, MMM, oropharynx clear Neck- Supple, no thyromegaly CVS- RRR, no murmur RESP-CTAB ABD-NABS,soft,NT,ND EXT- No edema Pulses- Radial, DP- 2+        Assessment & Plan:      Problem List Items Addressed This Visit      Unprioritized   Obesity    Dietary changes as well as the diet.  Recommend that she not do anything to restricted that she cannot follow through with Knies-term.  Okay to utilize protein shakes      HSV (herpes  simplex virus) infection    This was her first outbreak.  I have given her prescription for Valtrex to keep on hand in case she has a rebound infection.  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  I have given her a prescription of doxycycline to have on hand.  She will follow-up with her surgeon when she finds a time that she can have the surgery done.  She will also asked him about doing just one arm at a time      Relevant Medications   valACYclovir (VALTREX) 1000 MG tablet    Other Visit Diagnoses    Routine general medical examination at a health care facility       Physical done.  Immunizations up-to-date.  Fasting labs done.   Relevant Orders   CBC with Differential/Platelet   Comprehensive metabolic panel   Lipid panel      Note: This dictation was prepared with Dragon dictation along with smaller phrase technology. Any transcriptional errors that result from this process are unintentional.

## 2017-04-01 NOTE — Assessment & Plan Note (Signed)
This was her first outbreak.  I have given her prescription for Valtrex to keep on hand in case she has a rebound infection.  I did discuss with her suppression therapy if she starts to have more outbreaks

## 2017-05-20 ENCOUNTER — Encounter: Payer: Self-pay | Admitting: Family Medicine

## 2017-05-20 ENCOUNTER — Other Ambulatory Visit: Payer: Self-pay

## 2017-05-20 ENCOUNTER — Ambulatory Visit: Payer: BC Managed Care – PPO | Admitting: Family Medicine

## 2017-05-20 VITALS — BP 116/82 | HR 100 | Temp 98.9°F | Resp 14 | Ht 63.0 in | Wt 245.0 lb

## 2017-05-20 DIAGNOSIS — Z113 Encounter for screening for infections with a predominantly sexual mode of transmission: Secondary | ICD-10-CM | POA: Diagnosis not present

## 2017-05-20 DIAGNOSIS — Z202 Contact with and (suspected) exposure to infections with a predominantly sexual mode of transmission: Secondary | ICD-10-CM | POA: Diagnosis not present

## 2017-05-20 LAB — WET PREP FOR TRICH, YEAST, CLUE

## 2017-05-20 MED ORDER — METRONIDAZOLE 500 MG PO TABS: 500.0000 mg | ORAL_TABLET | Freq: Two times a day (BID) | ORAL | 0 refills | Status: DC

## 2017-05-20 MED ORDER — FLUCONAZOLE 150 MG PO TABS: ORAL_TABLET | ORAL | 1 refills | Status: DC

## 2017-05-20 NOTE — Progress Notes (Signed)
   Subjective:    Patient ID: Ashley Stanton, female    DOB: 07/11/1985, 32 y.o.   MRN: [id number]  Patient presents for STD Check  Pt here for STD screen.  She is sexually active with one partner.  She been having a mild vaginal bleeding which was not consistent with her.  For the past 3 weeks.  She is also had some itching.  Initially felt some swelling in the vaginal area.  She is also had some discharge.  She does have Nexplanon for birth control.  She works in a lab.  She did a blind swab on herself and did microscopy and showed trichomonas yesterday. She would like Complete STD screening    Review Of Systems:  GEN- denies fatigue, fever, weight loss,weakness, recent illness HEENT- denies eye drainage, change in vision, nasal discharge, CVS- denies chest pain, palpitations RESP- denies SOB, cough, wheeze ABD- denies N/V, change in stools, abd pain GU- denies dysuria, hematuria, dribbling, incontinence MSK- denies joint pain, muscle aches, injury Neuro- denies headache, dizziness, syncope, seizure activity       Objective:    BP 116/82   Pulse 100   Temp 98.9 F (37.2 C) (Oral)   Resp 14   Ht 5\' 3"  (1.6 m)   Wt 245 lb (111.1 kg)   SpO2 98%   BMI 43.40 kg/m  GEN- NAD, alert and oriented x3 HEENT- PERRL, EOMI, non injected sclera, pink conjunctiva, MMM, oropharynx clear ABD-NABS,soft,NT,ND GU- normal external genitalia, vaginal mucosa pink and moist, cervix visualized no growth, + blood form os, minimal thin clear discharge, no CMT, no ovarian masses, uterus normal size Pulses- Radial2+        Assessment & Plan:      Problem List Items Addressed This Visit    None    Visit Diagnoses    Screen for STD (sexually transmitted disease)    -  Primary   SCreening done, treat trichomonas, pt works in lab, previous lab employee here at clinic. Flagyl 500mg  BID given for 7 days, due to also hitory of bacterial infection/boils   Relevant Orders   WET PREP FOR TRICH,  YEAST, CLUE   C. trachomatis/N. gonorrhoeae RNA   Hepatitis C antibody   HIV antibody   RPR   Trichomonas exposure          Note: This dictation was prepared with Dragon dictation along with smaller phrase technology. Any transcriptional errors that result from this process are unintentional.

## 2017-05-20 NOTE — Patient Instructions (Signed)
We will call with results  F/U as previous       

## 2017-05-21 LAB — C. TRACHOMATIS/N. GONORRHOEAE RNA
C. trachomatis RNA, TMA: NOT DETECTED
N. GONORRHOEAE RNA, TMA: NOT DETECTED

## 2017-05-21 LAB — HEPATITIS C ANTIBODY
HEP C AB: NONREACTIVE
SIGNAL TO CUT-OFF: 0.05 (ref <1.00)

## 2017-05-21 LAB — RPR: RPR Ser Ql: NONREACTIVE

## 2017-05-21 LAB — HIV ANTIBODY (ROUTINE TESTING W REFLEX): HIV 1&2 Ab, 4th Generation: NONREACTIVE

## 2017-07-15 ENCOUNTER — Encounter: Payer: Self-pay | Admitting: Family Medicine

## 2017-07-16 MED ORDER — SULFAMETHOXAZOLE-TRIMETHOPRIM 800-160 MG PO TABS: 1.0000 | ORAL_TABLET | Freq: Two times a day (BID) | ORAL | 0 refills | Status: DC

## 2017-07-16 MED ORDER — FLUCONAZOLE 150 MG PO TABS: ORAL_TABLET | ORAL | 1 refills | Status: DC

## 2017-09-04 ENCOUNTER — Encounter: Payer: Self-pay | Admitting: Family Medicine

## 2017-09-22 ENCOUNTER — Other Ambulatory Visit: Payer: Self-pay

## 2017-09-22 ENCOUNTER — Ambulatory Visit (INDEPENDENT_AMBULATORY_CARE_PROVIDER_SITE_OTHER): Payer: BC Managed Care – PPO | Admitting: Family Medicine

## 2017-09-22 ENCOUNTER — Encounter: Payer: Self-pay | Admitting: Family Medicine

## 2017-09-22 VITALS — BP 118/68 | HR 90 | Temp 98.3°F | Resp 16 | Ht 63.0 in | Wt 245.0 lb

## 2017-09-22 DIAGNOSIS — L732 Hidradenitis suppurativa: Secondary | ICD-10-CM

## 2017-09-22 DIAGNOSIS — W57XXXS Bitten or stung by nonvenomous insect and other nonvenomous arthropods, sequela: Secondary | ICD-10-CM | POA: Diagnosis not present

## 2017-09-22 DIAGNOSIS — A5901 Trichomonal vulvovaginitis: Secondary | ICD-10-CM | POA: Diagnosis not present

## 2017-09-22 DIAGNOSIS — B351 Tinea unguium: Secondary | ICD-10-CM

## 2017-09-22 LAB — WET PREP FOR TRICH, YEAST, CLUE

## 2017-09-22 MED ORDER — DOXYCYCLINE HYCLATE 100 MG PO TABS: 100.0000 mg | ORAL_TABLET | Freq: Two times a day (BID) | ORAL | 0 refills | Status: DC

## 2017-09-22 MED ORDER — METRONIDAZOLE 500 MG PO TABS: 500.0000 mg | ORAL_TABLET | Freq: Two times a day (BID) | ORAL | 0 refills | Status: DC

## 2017-09-22 MED ORDER — FLUCONAZOLE 150 MG PO TABS
ORAL_TABLET | ORAL | 1 refills | Status: DC
Start: 2017-09-22 — End: 2018-03-31

## 2017-09-22 MED ORDER — TERBINAFINE HCL 250 MG PO TABS: 250.0000 mg | ORAL_TABLET | Freq: Every day | ORAL | 1 refills | Status: DC

## 2017-09-22 MED ORDER — VALACYCLOVIR HCL 1 G PO TABS: 1000.0000 mg | ORAL_TABLET | Freq: Two times a day (BID) | ORAL | 2 refills | Status: DC

## 2017-09-22 NOTE — Progress Notes (Signed)
Subjective:    Patient ID: Ashley Stanton, female    DOB: 06/14/1985, 32 y.o.   MRN: [id number]  Patient presents for UA (UA performed at work shows WBC and Trich); Abscess to Axilla (area has burst and drained but still irritated); and Nail Problem    Pt here due to Trichomonas and WBC on hER UA at work still been sexually active with her boyfriend who has known trichomonas he is not sought care.  He had noted some itching and then an HSV outbreak on her buttocks is which is where she typically gets it.  She did start the Valtrex which has helped.  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  She has not had any fever or chills.      Also had a tick bite a few months ago, still has itching, she initially had some drainage from the lesion she is not sure of the entire take it out as it will not heal over entirely.     Left foot- nail fungus on great toe      - took oral lamisil last year  has some itching around the nail would like to try the medication again to completely clear this up    Works at [employer]         Review Of Systems:  GEN- denies fatigue, fever, weight loss,weakness, recent illness HEENT- denies eye drainage, change in vision, nasal discharge, CVS- denies chest pain, palpitations RESP- denies SOB, cough, wheeze ABD- denies N/V, change in stools, abd pain GU- denies dysuria, hematuria, dribbling, incontinence MSK- denies joint pain, muscle aches, injury Neuro- denies headache, dizziness, syncope, seizure activity       Objective:    BP 118/68   Pulse 90   Temp 98.3 F (36.8 C) (Oral)   Resp 16   Ht 5\' 3"  (1.6 m)   Wt 245 lb (111.1 kg)   SpO2 99%   BMI 43.40 kg/m  GEN- NAD, alert and oriented x3 HEENT- PERRL, EOMI, non injected sclera, pink conjunctiva, MMM, oropharynx clear Neck- Supple, no LAD CVS- RRR, no  murmur RESP-CTAB ABD-NABS,soft,NT,ND GU- normal external genitalia, vaginal mucosa pink and moist, cervix visualized no growth, no blood form os, +discharge, no CMT, no ovarian masses, uterus normal size Skin- absces draining right axilla with large indurated region below, no fluctance no erythema, post right arm hyperpigmented raised scaley lesion, NT Left Great toenail, split down the center, brittle, yellowing at base  EXT- No edema Pulses- Radial, DP- 2+   Procedure- Incision and Drainage of tick bite site  Procedure explained to patient questions answered benefits and risks discussed written consent obtained. Antiseptic-Etoh Anesthesia-lidocaine no Epi Incision performed  With 11 blade small amount of bleeding but no tick seen, no pus  Minimal blood loss Patient tolerated procedure well Bandage applied      Assessment & Plan:      Problem List Items Addressed This Visit      Unprioritized   Hidradenitis suppurativa of right axilla    Recurrent lesions, already draining, pt knows wound care Doxycyline given      Relevant Medications   metroNIDAZOLE (FLAGYL) 500 MG tablet   terbinafine (LAMISIL) 250 MG tablet   fluconazole (DIFLUCAN) 150 MG tablet   valACYclovir (VALTREX) 1000 MG tablet    Other Visit Diagnoses    Trichomonal vaginitis    -  Primary   Treat with flagyl, seen on wet prep, GC pending, discussed Boyfriend needs to be treated   Relevant Medications   metroNIDAZOLE (FLAGYL) 500 MG tablet   terbinafine (LAMISIL) 250 MG tablet   fluconazole (DIFLUCAN) 150 MG tablet   valACYclovir (VALTREX) 1000 MG tablet   Other Relevant Orders   WET PREP FOR TRICH, YEAST, CLUE   C. trachomatis/N. gonorrhoeae RNA   Onychomycosis       Plan terbinafine after she completes antibiotics will treat 6-8 weeks as only 1 nail   Relevant Medications   metroNIDAZOLE (FLAGYL) 500 MG tablet   terbinafine (LAMISIL) 250 MG tablet   fluconazole (DIFLUCAN) 150 MG tablet    valACYclovir (VALTREX) 1000 MG tablet   Tick bite, sequela       No sign of superinfection or tick left in site      Note: This dictation was prepared with Dragon dictation along with smaller phrase technology. Any transcriptional errors that result from this process are unintentional.

## 2017-09-22 NOTE — Patient Instructions (Addendum)
Start the terbinafine Take the antibiotics Schedule a physical for 1st week of March

## 2017-09-22 NOTE — Assessment & Plan Note (Signed)
Recurrent lesions, already draining, pt knows wound care Doxycyline given

## 2017-09-23 LAB — C. TRACHOMATIS/N. GONORRHOEAE RNA
C. trachomatis RNA, TMA: NOT DETECTED
N. GONORRHOEAE RNA, TMA: NOT DETECTED

## 2017-12-23 ENCOUNTER — Ambulatory Visit: Payer: BC Managed Care – PPO | Admitting: Family Medicine

## 2018-01-13 ENCOUNTER — Encounter: Payer: Self-pay | Admitting: Family Medicine

## 2018-03-23 ENCOUNTER — Encounter: Payer: Self-pay | Admitting: Family Medicine

## 2018-03-23 MED ORDER — DOXYCYCLINE HYCLATE 100 MG PO TABS: 100.0000 mg | ORAL_TABLET | Freq: Two times a day (BID) | ORAL | 0 refills | Status: DC

## 2018-03-31 ENCOUNTER — Other Ambulatory Visit: Payer: Self-pay

## 2018-03-31 ENCOUNTER — Encounter: Payer: Self-pay | Admitting: *Deleted

## 2018-03-31 ENCOUNTER — Ambulatory Visit: Payer: BC Managed Care – PPO | Admitting: Family Medicine

## 2018-03-31 ENCOUNTER — Encounter: Payer: Self-pay | Admitting: Family Medicine

## 2018-03-31 VITALS — BP 112/68 | HR 92 | Temp 98.1°F | Resp 14 | Ht 63.0 in | Wt 253.0 lb

## 2018-03-31 DIAGNOSIS — L732 Hidradenitis suppurativa: Secondary | ICD-10-CM

## 2018-03-31 DIAGNOSIS — Z113 Encounter for screening for infections with a predominantly sexual mode of transmission: Secondary | ICD-10-CM

## 2018-03-31 DIAGNOSIS — Z6841 Body Mass Index (BMI) 40.0 and over, adult: Secondary | ICD-10-CM

## 2018-03-31 DIAGNOSIS — B009 Herpesviral infection, unspecified: Secondary | ICD-10-CM | POA: Diagnosis not present

## 2018-03-31 DIAGNOSIS — L02412 Cutaneous abscess of left axilla: Secondary | ICD-10-CM

## 2018-03-31 LAB — WET PREP FOR TRICH, YEAST, CLUE

## 2018-03-31 MED ORDER — VALACYCLOVIR HCL 500 MG PO TABS: 500.0000 mg | ORAL_TABLET | Freq: Every day | ORAL | 6 refills | Status: DC

## 2018-03-31 MED ORDER — FLUCONAZOLE 150 MG PO TABS: 150.0000 mg | ORAL_TABLET | Freq: Once | ORAL | 1 refills | Status: AC

## 2018-03-31 MED ORDER — CLINDAMYCIN HCL 300 MG PO CAPS: 300.0000 mg | ORAL_CAPSULE | Freq: Three times a day (TID) | ORAL | 1 refills | Status: DC

## 2018-03-31 MED ORDER — HYDROCODONE-ACETAMINOPHEN 5-325 MG PO TABS: 1.0000 | ORAL_TABLET | Freq: Four times a day (QID) | ORAL | 0 refills | Status: DC | PRN

## 2018-03-31 NOTE — Assessment & Plan Note (Signed)
Issues with hidradenitis.  I think that she does need surgical intervention she has had this for multiple years has significant sinus tracts in both axilla.  I did not have to actually I&D the left it was already draining significantly.  I was able to get a culture and concerned that she is growing some resistance Bactrim no longer works well she has been on rounds of doxycycline.  I been a try clindamycin I was able to pack with about 3 inches before she cannot tolerate any further.  I think that she needs to be seen urgently by general surgery to discuss further surgical management.  I am going to check labs today including an A1c.

## 2018-03-31 NOTE — Progress Notes (Signed)
Subjective:    Patient ID: Ashley Stanton, female    DOB: May 15, 1985, 33 y.o.   MRN: [id number]  Patient presents for Abscess (B axilla) and STD Check (new partner)  Patient here with recurrent hidradenitis problems.  She had a flare recently in her right axilla she was prescribed doxycycline a little over a week ago that did help she still has mild drainage but has very large swelling and abscess in the left axilla.  States that she has been using Epson salt warm soaks to get it to drain.  She has had this problem for multiple years.  She was seen by general surgery a year or so ago who did recommend an surgical intervention however she had 2 young children was unable to proceed with procedure at that time.  She has not had any fever no chills.  She has however had recurrent HSV outbreaks has taken Valtrex it seems that when she gets an abscess her HSV will then break out.     New Partner, request STD screen , no current symptoms   Review Of Systems:  GEN- denies fatigue, fever, weight loss,weakness, recent illness HEENT- denies eye drainage, change in vision, nasal discharge, CVS- denies chest pain, palpitations RESP- denies SOB, cough, wheeze ABD- denies N/V, change in stools, abd pain GU- denies dysuria, hematuria, dribbling, incontinence MSK- denies joint pain, muscle aches, injury Neuro- denies headache, dizziness, syncope, seizure activity       Objective:    BP 112/68   Pulse 92   Temp 98.1 F (36.7 C) (Oral)   Resp 14   Ht 5\' 3"  (1.6 m)   Wt 253 lb (114.8 kg)   SpO2 98%   BMI 44.82 kg/m  HEENT- PERRL, EOMI, non injected sclera, pink conjunctiva, MMM, oropharynx clear GU- normal external genitalia, vaginal mucosa pink and moist, cervix visualized no growth, no blood form os, +discharge, no CMT, no ovarian masses, uterus normal size Skin- very small abscess draining right axilla  More pea size, with minimal induration,mimal tenderness, Left axilla, draining copious amount  of foul odor pus  large indurated region adjacent , TTP, q tip used for culture and to break adhesion more pus drained EXT- No edema Pulses- Radial,  2+      Assessment & Plan:      Problem List Items Addressed This Visit      Unprioritized   Hidradenitis suppurativa    Issues with hidradenitis.  I think that she does need surgical intervention she has had this for multiple years has significant sinus tracts in both axilla.  I did not have to actually I&D the left it was already draining significantly.  I was able to get a culture and concerned that she is growing some resistance Bactrim no longer works well she has been on rounds of doxycycline.  I been a try clindamycin I was able to pack with about 3 Stanton before she cannot tolerate any further.  I think that she needs to be seen urgently by general surgery to discuss further surgical management.  I am going to check labs today including an A1c.      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  I am going to start her on Valtrex 500 mg daily for suppression.  She did request STD screen as she has a new partner  Diflucan given as she is now been on 2 rounds of antibiotics often gets yeast infections.      Relevant Medications   clindamycin (CLEOCIN) 300 MG capsule   fluconazole (DIFLUCAN) 150 MG tablet   valACYclovir (VALTREX) 500 MG tablet   Other Relevant Orders   CBC with Differential/Platelet   Comprehensive metabolic panel   Obesity   Relevant Orders   Hemoglobin A1c    Other Visit Diagnoses    Screen for STD (sexually transmitted disease)        Relevant Orders   WOUND CULTURE   HIV Antibody (routine testing w rflx)   RPR   Hepatitis C antibody   WET PREP FOR TRICH, YEAST, CLUE (Completed)   C. trachomatis/N. gonorrhoeae RNA   Abscess of left axilla       Relevant Orders   Hemoglobin A1c   Ambulatory referral to General Surgery      Note: This dictation was prepared with Dragon dictation along with smaller phrase technology. Any transcriptional errors that result from this process are unintentional.

## 2018-03-31 NOTE — Assessment & Plan Note (Addendum)
Think her immune system is causing a lot of issues with her recurrent infections.  I am going to start her on Valtrex 500 mg daily for suppression.  She did request STD screen as she has a new partner  Diflucan given as she is now been on 2 rounds of antibiotics often gets yeast infections.

## 2018-03-31 NOTE — Patient Instructions (Addendum)
Take Valtrex once a day for prophylaxis Take new antibiotic clindamycin  Take diflucan for yeast We will call with results REferral to general surgeon F/U schedule a physical

## 2018-04-01 ENCOUNTER — Encounter: Payer: Self-pay | Admitting: *Deleted

## 2018-04-01 LAB — COMPREHENSIVE METABOLIC PANEL
AG Ratio: 1 (calc) (ref 1.0–2.5)
ALT: 12 U/L (ref 6–29)
AST: 15 U/L (ref 10–30)
Albumin: 3.7 g/dL (ref 3.6–5.1)
Alkaline phosphatase (APISO): 73 U/L (ref 31–125)
BUN: 10 mg/dL (ref 7–25)
CO2: 20 mmol/L (ref 20–32)
CREATININE: 0.99 mg/dL (ref 0.50–1.10)
Calcium: 9.2 mg/dL (ref 8.6–10.2)
Chloride: 104 mmol/L (ref 98–110)
GLOBULIN: 3.7 g/dL (ref 1.9–3.7)
Glucose, Bld: 100 mg/dL — ABNORMAL HIGH (ref 65–99)
Potassium: 3.9 mmol/L (ref 3.5–5.3)
Sodium: 137 mmol/L (ref 135–146)
Total Bilirubin: 0.3 mg/dL (ref 0.2–1.2)
Total Protein: 7.4 g/dL (ref 6.1–8.1)

## 2018-04-01 LAB — HEPATITIS C ANTIBODY
Hepatitis C Ab: NONREACTIVE
SIGNAL TO CUT-OFF: 0.27 (ref <1.00)

## 2018-04-01 LAB — C. TRACHOMATIS/N. GONORRHOEAE RNA
C. trachomatis RNA, TMA: NOT DETECTED
N. gonorrhoeae RNA, TMA: NOT DETECTED

## 2018-04-01 LAB — RPR: RPR Ser Ql: NONREACTIVE

## 2018-04-01 LAB — HIV ANTIBODY (ROUTINE TESTING W REFLEX): HIV 1&2 Ab, 4th Generation: NONREACTIVE

## 2018-04-01 LAB — HEMOGLOBIN A1C
EAG (MMOL/L): 6.5 (calc)
Hgb A1c MFr Bld: 5.7 % of total Hgb — ABNORMAL HIGH (ref <5.7)
Mean Plasma Glucose: 117 (calc)

## 2018-04-03 LAB — CBC WITH DIFFERENTIAL/PLATELET
Absolute Monocytes: 491 cells/uL (ref 200–950)
BASOS ABS: 11 {cells}/uL (ref 0–200)
Basophils Relative: 0.1 %
EOS ABS: 87 {cells}/uL (ref 15–500)
Eosinophils Relative: 0.8 %
HCT: 39.2 % (ref 35.0–45.0)
Hemoglobin: 13.3 g/dL (ref 11.7–15.5)
Lymphs Abs: 3194 cells/uL (ref 850–3900)
MCH: 28.5 pg (ref 27.0–33.0)
MCHC: 33.9 g/dL (ref 32.0–36.0)
MCV: 84.1 fL (ref 80.0–100.0)
MONOS PCT: 4.5 %
MPV: 9.8 fL (ref 7.5–12.5)
NEUTROS PCT: 65.3 %
Neutro Abs: 7118 cells/uL (ref 1500–7800)
PLATELETS: 372 10*3/uL (ref 140–400)
RBC: 4.66 10*6/uL (ref 3.80–5.10)
RDW: 13 % (ref 11.0–15.0)
TOTAL LYMPHOCYTE: 29.3 %
WBC: 10.9 10*3/uL — ABNORMAL HIGH (ref 3.8–10.8)

## 2018-04-03 LAB — WOUND CULTURE
MICRO NUMBER: 245573
SPECIMEN QUALITY:: ADEQUATE

## 2018-06-03 ENCOUNTER — Encounter: Payer: Self-pay | Admitting: Family Medicine

## 2018-06-03 ENCOUNTER — Ambulatory Visit (INDEPENDENT_AMBULATORY_CARE_PROVIDER_SITE_OTHER): Payer: BC Managed Care – PPO | Admitting: Family Medicine

## 2018-06-03 ENCOUNTER — Other Ambulatory Visit: Payer: Self-pay

## 2018-06-03 DIAGNOSIS — L02412 Cutaneous abscess of left axilla: Secondary | ICD-10-CM

## 2018-06-03 DIAGNOSIS — L732 Hidradenitis suppurativa: Secondary | ICD-10-CM

## 2018-06-03 MED ORDER — FLUCONAZOLE 150 MG PO TABS: 150.0000 mg | ORAL_TABLET | ORAL | 0 refills | Status: DC | PRN

## 2018-06-03 MED ORDER — SULFAMETHOXAZOLE-TRIMETHOPRIM 800-160 MG PO TABS: 1.0000 | ORAL_TABLET | Freq: Two times a day (BID) | ORAL | 0 refills | Status: AC

## 2018-06-03 MED ORDER — CLINDAMYCIN PHOS-BENZOYL PEROX 1-5 % EX GEL: Freq: Two times a day (BID) | CUTANEOUS | 0 refills | Status: DC

## 2018-06-03 NOTE — Telephone Encounter (Signed)
Can you see if she would do a quick video call with me?  Just to visualize it and document for the meds.  Thanks

## 2018-06-03 NOTE — Progress Notes (Signed)
Patient ID: Ashley Stanton, female    DOB: 03/19/1985, 33 y.o.   MRN: [id number]  PCP: Salley Scarleturham, Kawanta F, MD   Virtual Visit via video encounter  Phone visit arranged with Raynelle A Behne for 06/03/18 at 12:30 PM EDT  Services provided today were via telemedicine through video call. Start of phone call:  12:33 PM  I verified that I was speaking with the correct person using two identifiers. Patient reported their location during encounter was at work  Patient consented to telephone visit  [facility]  Referring Provider:   Jeanice Limurham, Velna HatchetKawanta F, MD   All participants in encounter:  Myself and the patient   I discussed the limitations, risks, security and privacy concerns of performing an evaluation and management service by telephone and the availability of in person appointments. I also discussed with the patient that there may be a patient responsible charge related to this service. The patient expressed understanding and agreed to proceed.  Chief Complaint  Patient presents with  . Recurrent Skin Infections    Subjective:   Ashley Stanton is a 33 y.o. female, with CC of abscess to left axilla pain worsening abscess, spreading, tracking and draining. She has hx of recurrent and worsening abscesses and HS.  Eval by specialist has been delayed due to COVID-19.  She last had abx for this same abscess in left axilla about a month ago. She states it has gotten larger, tracking down with multiple openings and is draining.  She feels like it is worse and requests abx.  She states doxycyline that she normally gets does't seem to be working like it used to. She requests diflucan for secondary yeast infections when on abx She denies associated fever, chills, sweats, N, V. Pain is currently a 3/10.  Pt was able to video encounter from work, pain and spreading is not affecting her ability to work, but she was concerned with enlarging size  and new sinus tracks   Patient Active Problem List   Diagnosis Date Noted  . HSV (herpes simplex virus) infection 04/01/2017  . Insomnia 03/12/2016  . Tinea pedis 03/12/2016  . Obesity 10/13/2015  . Hidradenitis suppurativa 07/14/2014    Prior to Admission medications   Medication Sig Start Date End Date Taking? Authorizing Provider  clindamycin (CLEOCIN) 300 MG capsule Take 1 capsule (300 mg total) by mouth 3 (three) times daily. 03/31/18   Salley Scarleturham, Kawanta F, MD  etonogestrel (NEXPLANON) 68 MG IMPL implant 1 each by Subdermal route once.    [provider]  HYDROcodone-acetaminophen (NORCO) 5-325 MG tablet Take 1 tablet by mouth every 6 (six) hours as needed for moderate pain. 03/31/18   Salley Scarleturham, Kawanta F, MD  valACYclovir (VALTREX) 1000 MG tablet Take 1 tablet (1,000 mg total) by mouth 2 (two) times daily. 09/22/17   Salley Scarleturham, Kawanta F, MD  valACYclovir (VALTREX) 500 MG tablet Take 1 tablet (500 mg total) by mouth daily. 03/31/18   Salley Scarleturham, Kawanta F, MD    Allergies  Allergen Reactions  . Citrus    10 Systems reviewed and are negative for acute change except as noted in the HPI.  Review of Systems     Objective:    There were no vitals filed for this visit.    Physical Exam Vitals signs and nursing note reviewed.  Constitutional:      General: She is not in acute distress.    Appearance: Normal appearance. She  is well-developed. She is not ill-appearing, toxic-appearing or diaphoretic.  HENT:     Head: Normocephalic and atraumatic.     Nose: Nose normal.  Eyes:     General:        Right eye: No discharge.        Left eye: No discharge.     Conjunctiva/sclera: Conjunctivae normal.  Neck:     Trachea: No tracheal deviation.  Cardiovascular:     Rate and Rhythm: Normal rate and regular rhythm.  Pulmonary:     Effort: Pulmonary effort is normal. No respiratory distress.     Breath sounds: No stridor.  Musculoskeletal: Normal range of motion.  Skin:     General: Skin is warm and dry.     Findings: No rash.     Comments: Left axilla linear distribution of edematous and mildly erythematous abscess with visible sinus tracks, some open areas inferiorly and posteriorly that have active drainage, mild ttp, no visualized streaking or spreading redness  Neurological:     Mental Status: She is alert.     Motor: No abnormal muscle tone.     Coordination: Coordination normal.  Psychiatric:        Behavior: Behavior normal.          Assessment & Plan:     ICD-10-CM   1. Abscess of left axilla L02.412 fluconazole (DIFLUCAN) 150 MG tablet    clindamycin-benzoyl peroxide (BENZACLIN) gel  2. Hidradenitis suppurativa L73.2 sulfamethoxazole-trimethoprim (BACTRIM DS) 800-160 MG tablet    fluconazole (DIFLUCAN) 150 MG tablet    clindamycin-benzoyl peroxide (BENZACLIN) gel  Worsening abscess, try empiric tx with bactrim since pt states doxy now ineffective and pt unable to see specialist.  Encouraging that it is draining.   F/up with any worsening   I discussed the assessment and treatment plan with the patient. The patient was provided an opportunity to ask questions and all were answered. The patient agreed with the plan and demonstrated an understanding of the instructions.   The patient was advised to call back or seek an in-person evaluation if the symptoms worsen or if the condition fails to improve as anticipated.  Phone call concluded at 12:40  I provided 7  minutes of non-face-to-face time during this encounter.  Danelle Berry, PA-C 06/03/18 12:33 PM

## 2018-06-03 NOTE — Telephone Encounter (Signed)
Thanks

## 2018-07-08 ENCOUNTER — Ambulatory Visit: Payer: Self-pay | Admitting: Surgery

## 2018-07-08 NOTE — H&P (Signed)
Ashley Stanton Documented: 07/08/2018 9:26 AM Location: [facility] Patient #: [id number] DOB: 1985/04/08 Single / Language: [place] / Race: Black or African American Female  History of Present Illness (Ibtisam Benge A. Fredricka Bonine MD; 07/08/2018 9:43 AM) Patient words: She returns to discuss scheduling axillary hidradenitis excision. She has had several flares since I last saw her in March. She has continuous drainage on the left side. She has also started to develop some lesions in her groin. She is ready to go ahead with scheduling surgery.  The patient is a 33 year old female.   Allergies (Sabrina Canty, CMA; 07/08/2018 9:27 AM) No Known Allergies [07/08/2018]: No Known Drug Allergies [06/19/2016]: Allergies Reconciled  Medication History Kristen Cardinal, CMA; 07/08/2018 9:27 AM) Doxycycline Hyclate (100MG  Tablet, Oral) Active. valACYclovir HCl (1GM Tablet, Oral) Active. Medications Reconciled     Review of Systems (Tayelor Osborne A. Fredricka Bonine MD; 07/08/2018 9:43 AM) All other systems negative  Vitals (Sabrina Canty CMA; 07/08/2018 9:27 AM) 07/08/2018 9:27 AM Weight: 256.5 lb Height: 63in Body Surface Area: 2.15 m Body Mass Index: 45.44 kg/m  Temp.: 97.71F(Temporal)  Pulse: 122 (Regular)  BP: 178/92 (Sitting, Left Arm, Standard)      Physical Exam (Yeiden Frenkel A. Fredricka Bonine MD; 07/08/2018 9:44 AM)  The physical exam findings are as follows: Note:Gen: alert and well appearing Eye: extraocular motion intact, no scleral icterus ENT: moist mucus membranes, dentition intact Neck: no mass or thyromegaly Chest: unlabored respirations, symmetrical air entry, clear bilaterally CV: regular rate and rhythm, no pedal edema Abdomen: soft, nontender, nondistended. No mass or organomegaly MSK: strength symmetrical throughout, no deformity Neuro: grossly intact, normal gait Psych: normal mood and affect, appropriate insight Skin: warm and dry. In the right axilla there were couple of  areas of induration and scarring but no active fluctuance or inflammation. In the left axilla along the crease there is approximately a 8 x 3 cm strip of chronic induration with a couple draining areas. She also has some mild dermatitis inferior to the lower aspect of the hairline which she states is from wearing a "sweat vest" for weight loss    Assessment & Plan (Promise Weldin A. Fredricka Bonine MD; 07/08/2018 9:44 AM)  HIDRADENITIS AXILLARIS (L73.2) Story: We discussed at her last visit the pathophysiology of this disease and treatment options including -brewers yeast free diet, -no shave, no chemical deodorants, keeping the area clean and dry, -topical treatments such as clindamycin/retinoin, -suppressive oral antibiotics, -systemic immunotherapy, (arranged by dermatology) -and finally surgery which ranges from local unroofing to wide excision with rotational flap coverage would be the only way to ensure no further flares, we discussed that this is a large surgery requiring several weeks of wound healing, with risk of bleeding, infection, pain, scarring and nearly 100% issue with wound healing. Discussed that would only do one side at a time.   She is considered the above and is now wishing to proceed with surgical excision.

## 2018-07-09 ENCOUNTER — Ambulatory Visit: Payer: BC Managed Care – PPO | Admitting: Family Medicine

## 2018-07-13 ENCOUNTER — Encounter: Payer: Self-pay | Admitting: Family Medicine

## 2018-07-13 ENCOUNTER — Telehealth: Payer: Self-pay | Admitting: *Deleted

## 2018-07-13 ENCOUNTER — Other Ambulatory Visit: Payer: Self-pay

## 2018-07-13 ENCOUNTER — Ambulatory Visit (INDEPENDENT_AMBULATORY_CARE_PROVIDER_SITE_OTHER): Payer: BC Managed Care – PPO | Admitting: Family Medicine

## 2018-07-13 VITALS — BP 120/70 | HR 118 | Temp 99.1°F | Resp 15 | Ht 63.0 in | Wt 256.0 lb

## 2018-07-13 DIAGNOSIS — Z720 Tobacco use: Secondary | ICD-10-CM | POA: Diagnosis not present

## 2018-07-13 DIAGNOSIS — Z6841 Body Mass Index (BMI) 40.0 and over, adult: Secondary | ICD-10-CM | POA: Diagnosis not present

## 2018-07-13 DIAGNOSIS — R7303 Prediabetes: Secondary | ICD-10-CM | POA: Diagnosis not present

## 2018-07-13 MED ORDER — LIRAGLUTIDE -WEIGHT MANAGEMENT 18 MG/3ML ~~LOC~~ SOPN: 0.6000 mg | PEN_INJECTOR | Freq: Every day | SUBCUTANEOUS | 3 refills | Status: DC

## 2018-07-13 MED ORDER — VARENICLINE TARTRATE 0.5 MG X 11 & 1 MG X 42 PO MISC: ORAL | 0 refills | Status: DC

## 2018-07-13 MED ORDER — INSULIN PEN NEEDLE 32G X 4 MM MISC: 1 refills | Status: DC

## 2018-07-13 NOTE — Assessment & Plan Note (Signed)
Strong family history of diabetes mellitus and now morbidly obese with increasing weight.  Recommend she continue with the weight watchers.  We discussed the use of Saxenda she wants to try this.  The nurse did show her how to operate this.  We will start with 0.6 mg and titrate up to 3 mg.  We will follow-up in the office in 2 months for weight check.

## 2018-07-13 NOTE — Assessment & Plan Note (Signed)
Trial of Chantix.  I think that tobacco cessation prior to her surgical intervention will definitely help with wound healing

## 2018-07-13 NOTE — Progress Notes (Signed)
   Subjective:    Patient ID: Ashley Stanton, female    DOB: March 12, 1985, 33 y.o.   MRN: [id number]  Patient presents for Weight Loss (Patient in today to discuss started something for weight loss)  Here to discuss her weight.  Of note she is already been seen by general surgeon for her hidradenitis superlative they are planning on surgical intervention.  Told no plastic surgery , declined immunotherapy   Smoking 1/2 PPD, wants to quit   Started weight watchers 1 month ago, lowest weight 254lbs Walking on days off      Review Of Systems:  GEN- denies fatigue, fever, weight loss,weakness, recent illness HEENT- denies eye drainage, change in vision, nasal discharge, CVS- denies chest pain, palpitations RESP- denies SOB, cough, wheeze ABD- denies N/V, change in stools, abd pain GU- denies dysuria, hematuria, dribbling, incontinence MSK- denies joint pain, muscle aches, injury Neuro- denies headache, dizziness, syncope, seizure activity       Objective:    BP 120/70   Pulse (!) 118   Temp 99.1 F (37.3 C) (Oral)   Resp 15   Ht 5\' 3"  (1.6 m)   Wt 256 lb (116.1 kg)   SpO2 100%   BMI 45.35 kg/m  GEN- NAD, alert and oriented x3 HEENT- PERRL, EOMI, non injected sclera, pink conjunctiva, MMM, oropharynx clear Neck- Supple, no thyromegaly CVS- RR, HR  100  no murmur RESP-CTAB EXT- No edema Pulses- Radial 2+        Assessment & Plan:    Recent labs done in February reviewed Problem List Items Addressed This Visit      Unprioritized   Borderline diabetes    Strong family history of diabetes mellitus and now morbidly obese with increasing weight.  Recommend she continue with the weight watchers.  We discussed the use of Saxenda she wants to try this.  The nurse did show her how to operate this.  We will start with 0.6 mg and titrate up to 3 mg.  We will follow-up in the office in 2 months for weight check.      Relevant Orders   Basic metabolic panel   Hemoglobin A1c   Obesity - Primary   Tobacco use    Trial of Chantix.  I think that tobacco cessation prior to her surgical intervention will definitely help with wound healing         Note: This dictation was prepared with Dragon dictation along with smaller phrase technology. Any transcriptional errors that result from this process are unintentional.

## 2018-07-13 NOTE — Telephone Encounter (Signed)
Your information has been submitted to [employer]. To check for an updated outcome later, reopen this PA request from your dashboard. If [employer] has not responded to your request within 24 hours, contact [employer] at [phone]. If you think there may be a problem with your PA request, use our live chat feature at the bottom right.    

## 2018-07-13 NOTE — Telephone Encounter (Signed)
Received request from pharmacy for PA on Saxenda.   PA submitted.   Dx: E66.01- obesity.  

## 2018-07-13 NOTE — Patient Instructions (Signed)
Start saxenda:  Week 1: 0.6mg  injected daily:   Week 2:  1.2 mg injected daily  Week 3:  1.8mg  injected daily  Week 4:  2.4mg  injected daily  Week 5:  3mg  injected daily Start chantix for tobacco cessation F/U 2 months for weight and labs

## 2018-07-13 NOTE — Telephone Encounter (Signed)
Received PA determination.   PA approved 07/13/2018 - 11/12/2018.  Pharmacy made aware.

## 2018-07-14 LAB — HEMOGLOBIN A1C
Hgb A1c MFr Bld: 5.5 % of total Hgb (ref <5.7)
Mean Plasma Glucose: 111 (calc)
eAG (mmol/L): 6.2 (calc)

## 2018-07-14 LAB — BASIC METABOLIC PANEL
BUN: 9 mg/dL (ref 7–25)
CO2: 20 mmol/L (ref 20–32)
Calcium: 9.7 mg/dL (ref 8.6–10.2)
Chloride: 108 mmol/L (ref 98–110)
Creat: 0.93 mg/dL (ref 0.50–1.10)
Glucose, Bld: 75 mg/dL (ref 65–99)
Potassium: 4.7 mmol/L (ref 3.5–5.3)
Sodium: 139 mmol/L (ref 135–146)

## 2018-07-31 ENCOUNTER — Other Ambulatory Visit: Payer: Self-pay | Admitting: Family Medicine

## 2018-07-31 DIAGNOSIS — Z20822 Contact with and (suspected) exposure to covid-19: Secondary | ICD-10-CM

## 2018-08-05 NOTE — Pre-Procedure Instructions (Signed)
Ashley BealsSharda A Stanton  08/05/2018      [facility] [address] - [place] - [address] [place] [id number] Phone: [phone] Fax: [phone]    Your procedure is scheduled on Tues., August 10, 2018 from 7:30AM-9:00AM  [facility] at 5:30AM  Call this number if you have problems the morning of surgery:  [phone]   Remember:  Do not eat or drink after midnight on July 6th    Take these medicines the morning of surgery with A SIP OF WATER: Varenicline  If needed: Cetirizine (ZYRTEC)  As of today, stop taking all Aspirin (unless instructed by your doctor) and Other Aspirin containing products, Vitamins, Fish oils, and Herbal medications. Also stop all NSAIDS i.e. Advil, Ibuprofen, Motrin, Aleve, Anaprox, Naproxen, BC, Goody Powders, and all Supplements.  Do Not take Liraglutide -Weight Management (SAXENDA) the day of surgery.   Special instructions:    [place]- Preparing For Surgery  Before surgery, you can play an important role. Because skin is not sterile, your skin needs to be as free of germs as possible. You can reduce the number of germs on your skin by washing with CHG (chlorahexidine gluconate) Soap before surgery.  CHG is an antiseptic cleaner which kills germs and bonds with the skin to continue killing germs even after washing.   Please do not use if you have an allergy to CHG or antibacterial soaps. If your skin becomes reddened/irritated stop using the CHG.  Do not shave (including legs and underarms) for at least 48 hours prior to first CHG shower. It is OK to shave your face.  Please follow these instructions carefully.   1. Shower the NIGHT BEFORE SURGERY and the MORNING OF SURGERY with CHG.   2. If you chose to wash your hair, wash your hair first as usual with your normal shampoo.  3. After you shampoo, rinse your hair and body thoroughly to remove the  shampoo.  4. Use CHG as you would any other liquid soap. You can apply CHG directly to the skin and wash gently with a scrungie or a clean washcloth.   5. Apply the CHG Soap to your body ONLY FROM THE NECK DOWN.  Do not use on open wounds or open sores. Avoid contact with your eyes, ears, mouth and genitals (private parts). Wash Face and genitals (private parts)  with your normal soap.  6. Wash thoroughly, paying special attention to the area where your surgery will be performed.  7. Thoroughly rinse your body with warm water from the neck down.  8. DO NOT shower/wash with your normal soap after using and rinsing off the CHG Soap.  9. Pat yourself dry with a CLEAN TOWEL.  10. Wear CLEAN PAJAMAS to bed the night before surgery, wear comfortable clothes the morning of surgery  11. Place CLEAN SHEETS on your bed the night of your first shower and DO NOT SLEEP WITH PETS.   Day of Surgery:   Oral Hygiene is also important to reduce your risk of infection.  Remember - BRUSH YOUR TEETH THE MORNING OF SURGERY WITH YOUR REGULAR TOOTHPASTE   Do not wear jewelry, make-up or nail polish.  Do not wear lotions, powders, or perfumes, or deodorant.  Do not shave 48 hours prior to surgery.    Do not bring valuables to the hospital.  [facility] is not responsible for any belongings or valuables.  Contacts, dentures  or bridgework may not be worn into surgery.    For patients admitted to the hospital, discharge time will be determined by your treatment team.  Patients discharged the day of surgery will not be allowed to drive home.   Please wear clean clothes to the hospital/surgery center.     Please read over the following fact sheets that you were given. Pain Booklet, Coughing and Deep Breathing and Surgical Site Infection Prevention

## 2018-08-06 ENCOUNTER — Other Ambulatory Visit
Admission: RE | Admit: 2018-08-06 | Discharge: 2018-08-06 | Disposition: A | Discharge status: Home / Self Care | Payer: BC Managed Care – PPO | Source: Ambulatory Visit | Attending: Surgery | Admitting: Surgery

## 2018-08-06 ENCOUNTER — Other Ambulatory Visit: Payer: Self-pay

## 2018-08-06 ENCOUNTER — Inpatient Hospital Stay: Admission: RE | Admit: 2018-08-06 | Payer: Self-pay | Source: Ambulatory Visit

## 2018-08-06 DIAGNOSIS — Z01812 Encounter for preprocedural laboratory examination: Secondary | ICD-10-CM | POA: Insufficient documentation

## 2018-08-06 DIAGNOSIS — Z1159 Encounter for screening for other viral diseases: Secondary | ICD-10-CM | POA: Diagnosis not present

## 2018-08-06 LAB — NOVEL CORONAVIRUS, NAA: SARS-CoV-2, NAA: NOT DETECTED

## 2018-08-06 LAB — SARS CORONAVIRUS 2 (TAT 6-24 HRS): SARS Coronavirus 2: NEGATIVE

## 2018-08-09 ENCOUNTER — Encounter (HOSPITAL_COMMUNITY): Payer: Self-pay

## 2018-08-09 ENCOUNTER — Encounter (HOSPITAL_COMMUNITY)
Admission: RE | Admit: 2018-08-09 | Discharge: 2018-08-09 | Disposition: A | Discharge status: Home / Self Care | Payer: BC Managed Care – PPO | Source: Ambulatory Visit | Attending: Surgery | Admitting: Surgery

## 2018-08-09 ENCOUNTER — Other Ambulatory Visit: Payer: Self-pay

## 2018-08-09 DIAGNOSIS — Z01812 Encounter for preprocedural laboratory examination: Secondary | ICD-10-CM | POA: Diagnosis present

## 2018-08-09 LAB — HEMOGLOBIN: Hemoglobin: 13.9 g/dL (ref 12.0–15.0)

## 2018-08-09 MED ORDER — BUPIVACAINE LIPOSOME 1.3 % IJ SUSP
20.0000 mL | INTRAMUSCULAR | Status: AC
  Administered 2018-08-10: 20 mL
  Filled 2018-08-09: qty 20

## 2018-08-09 NOTE — Anesthesia Preprocedure Evaluation (Addendum)
Anesthesia Evaluation  Patient identified by MRN, date of birth, ID band Patient awake    Reviewed: Allergy & Precautions, NPO status , Patient's Chart, lab work & pertinent test results  History of Anesthesia Complications Negative for: history of anesthetic complications  Airway Mallampati: II  TM Distance: >3 FB Neck ROM: Full    Dental no notable dental hx.    Pulmonary Current Smoker,    Pulmonary exam normal        Cardiovascular negative cardio ROS Normal cardiovascular exam     Neuro/Psych negative neurological ROS     GI/Hepatic negative GI ROS, Neg liver ROS,   Endo/Other  Morbid obesity  Renal/GU negative Renal ROS     Musculoskeletal negative musculoskeletal ROS (+)   Abdominal   Peds  Hematology negative hematology ROS (+)   Anesthesia Other Findings Axillary hidradenitis  Reproductive/Obstetrics                            Anesthesia Physical Anesthesia Plan  ASA: III  Anesthesia Plan: General   Post-op Pain Management:    Induction: Intravenous  PONV Risk Score and Plan: 3 and Treatment may vary due to age or medical condition, Ondansetron, Dexamethasone, Midazolam and Scopolamine patch - Pre-op  Airway Management Planned: LMA  Additional Equipment:   Intra-op Plan:   Post-operative Plan: Extubation in OR  Informed Consent: I have reviewed the patients History and Physical, chart, labs and discussed the procedure including the risks, benefits and alternatives for the proposed anesthesia with the patient or authorized representative who has indicated his/her understanding and acceptance.     Dental advisory given  Plan Discussed with: CRNA  Anesthesia Plan Comments:      Anesthesia Quick Evaluation

## 2018-08-09 NOTE — Progress Notes (Signed)
Denies fever, cough, shob, COVID-19 exposure, or travel since COVID test. Patient reports compliance with quarantine. Denies chest pain, shob, cardiologist visit, cardiac test, or sleep study. PCP Dr. Buelah Manis last OV note in chart. Verbalized understanding of all preop instructions.

## 2018-08-10 ENCOUNTER — Encounter (HOSPITAL_COMMUNITY)
Admission: RE | Disposition: A | Discharge status: Home / Self Care | Payer: Self-pay | Source: Home / Self Care | Attending: Surgery

## 2018-08-10 ENCOUNTER — Ambulatory Visit (HOSPITAL_COMMUNITY): Payer: BC Managed Care – PPO | Admitting: Anesthesiology

## 2018-08-10 ENCOUNTER — Ambulatory Visit (HOSPITAL_COMMUNITY)
Admission: RE | Admit: 2018-08-10 | Discharge: 2018-08-10 | Disposition: A | Discharge status: Home / Self Care | Payer: BC Managed Care – PPO | Attending: Surgery | Admitting: Surgery

## 2018-08-10 ENCOUNTER — Encounter (HOSPITAL_COMMUNITY): Payer: Self-pay | Admitting: *Deleted

## 2018-08-10 DIAGNOSIS — Z6841 Body Mass Index (BMI) 40.0 and over, adult: Secondary | ICD-10-CM | POA: Diagnosis not present

## 2018-08-10 DIAGNOSIS — L732 Hidradenitis suppurativa: Secondary | ICD-10-CM | POA: Insufficient documentation

## 2018-08-10 DIAGNOSIS — F17200 Nicotine dependence, unspecified, uncomplicated: Secondary | ICD-10-CM | POA: Insufficient documentation

## 2018-08-10 HISTORY — PX: HYDRADENITIS EXCISION: SHX5243

## 2018-08-10 LAB — POCT PREGNANCY, URINE: Preg Test, Ur: NEGATIVE

## 2018-08-10 SURGERY — EXCISION, HIDRADENITIS, AXILLA
Anesthesia: General | Site: Axilla | Laterality: Left

## 2018-08-10 MED ORDER — OXYCODONE HCL 5 MG PO TABS
5.0000 mg | ORAL_TABLET | Freq: Once | ORAL | Status: AC | PRN
  Administered 2018-08-10: 5 mg via ORAL

## 2018-08-10 MED ORDER — CEFAZOLIN SODIUM-DEXTROSE 2-4 GM/100ML-% IV SOLN
2.0000 g | INTRAVENOUS | Status: AC
  Administered 2018-08-10: 2 g via INTRAVENOUS
  Filled 2018-08-10: qty 100

## 2018-08-10 MED ORDER — GABAPENTIN 300 MG PO CAPS
300.0000 mg | ORAL_CAPSULE | ORAL | Status: AC
  Administered 2018-08-10: 300 mg via ORAL
  Filled 2018-08-10: qty 1

## 2018-08-10 MED ORDER — ACETAMINOPHEN 10 MG/ML IV SOLN: 1000.0000 mg | Freq: Once | INTRAVENOUS | Status: DC | PRN

## 2018-08-10 MED ORDER — OXYCODONE HCL 5 MG/5ML PO SOLN: 5.0000 mg | Freq: Once | ORAL | Status: AC | PRN

## 2018-08-10 MED ORDER — MIDAZOLAM HCL 2 MG/2ML IJ SOLN
INTRAMUSCULAR | Status: DC | PRN
  Administered 2018-08-10: 2 mg via INTRAVENOUS

## 2018-08-10 MED ORDER — MIDAZOLAM HCL 2 MG/2ML IJ SOLN
INTRAMUSCULAR | Status: AC
  Filled 2018-08-10: qty 2

## 2018-08-10 MED ORDER — LACTATED RINGERS IV SOLN
INTRAVENOUS | Status: DC | PRN
  Administered 2018-08-10: 07:00:00 via INTRAVENOUS

## 2018-08-10 MED ORDER — OXYCODONE HCL 5 MG PO TABS
ORAL_TABLET | ORAL | Status: AC
  Filled 2018-08-10: qty 1

## 2018-08-10 MED ORDER — CELECOXIB 200 MG PO CAPS
200.0000 mg | ORAL_CAPSULE | ORAL | Status: AC
  Administered 2018-08-10: 200 mg via ORAL
  Filled 2018-08-10: qty 1

## 2018-08-10 MED ORDER — CHLORHEXIDINE GLUCONATE 4 % EX LIQD: 60.0000 mL | Freq: Once | CUTANEOUS | Status: DC

## 2018-08-10 MED ORDER — FENTANYL CITRATE (PF) 250 MCG/5ML IJ SOLN
INTRAMUSCULAR | Status: AC
  Filled 2018-08-10: qty 5

## 2018-08-10 MED ORDER — 0.9 % SODIUM CHLORIDE (POUR BTL) OPTIME
TOPICAL | Status: DC | PRN
  Administered 2018-08-10: 1000 mL

## 2018-08-10 MED ORDER — BUPIVACAINE HCL (PF) 0.25 % IJ SOLN
INTRAMUSCULAR | Status: AC
  Filled 2018-08-10: qty 30

## 2018-08-10 MED ORDER — FENTANYL CITRATE (PF) 250 MCG/5ML IJ SOLN
INTRAMUSCULAR | Status: DC | PRN
  Administered 2018-08-10 (×5): 50 ug via INTRAVENOUS

## 2018-08-10 MED ORDER — ONDANSETRON HCL 4 MG/2ML IJ SOLN
INTRAMUSCULAR | Status: DC | PRN
  Administered 2018-08-10: 4 mg via INTRAVENOUS

## 2018-08-10 MED ORDER — FENTANYL CITRATE (PF) 100 MCG/2ML IJ SOLN: 25.0000 ug | INTRAMUSCULAR | Status: DC | PRN

## 2018-08-10 MED ORDER — HYDROCODONE-ACETAMINOPHEN 5-325 MG PO TABS: 1.0000 | ORAL_TABLET | Freq: Four times a day (QID) | ORAL | 0 refills | Status: DC | PRN

## 2018-08-10 MED ORDER — PROPOFOL 10 MG/ML IV BOLUS
INTRAVENOUS | Status: DC | PRN
  Administered 2018-08-10: 200 mg via INTRAVENOUS

## 2018-08-10 MED ORDER — ACETAMINOPHEN 500 MG PO TABS
1000.0000 mg | ORAL_TABLET | ORAL | Status: AC
  Administered 2018-08-10: 1000 mg via ORAL
  Filled 2018-08-10: qty 2

## 2018-08-10 MED ORDER — BUPIVACAINE-EPINEPHRINE (PF) 0.25% -1:200000 IJ SOLN
INTRAMUSCULAR | Status: AC
  Filled 2018-08-10: qty 30

## 2018-08-10 MED ORDER — PROMETHAZINE HCL 25 MG/ML IJ SOLN: 6.2500 mg | INTRAMUSCULAR | Status: DC | PRN

## 2018-08-10 MED ORDER — DOCUSATE SODIUM 100 MG PO CAPS: 100.0000 mg | ORAL_CAPSULE | Freq: Two times a day (BID) | ORAL | 0 refills | Status: AC

## 2018-08-10 MED ORDER — LIDOCAINE 2% (20 MG/ML) 5 ML SYRINGE
INTRAMUSCULAR | Status: DC | PRN
  Administered 2018-08-10: 100 mg via INTRAVENOUS

## 2018-08-10 MED ORDER — DEXAMETHASONE SODIUM PHOSPHATE 10 MG/ML IJ SOLN
INTRAMUSCULAR | Status: DC | PRN
  Administered 2018-08-10: 10 mg via INTRAVENOUS

## 2018-08-10 SURGICAL SUPPLY — 37 items
BLADE CLIPPER SURG (BLADE) IMPLANT
BNDG GAUZE ELAST 4 BULKY (GAUZE/BANDAGES/DRESSINGS) IMPLANT
CANISTER SUCT 3000ML PPV (MISCELLANEOUS) ×2 IMPLANT
COVER SURGICAL LIGHT HANDLE (MISCELLANEOUS) ×2 IMPLANT
COVER WAND RF STERILE (DRAPES) ×1 IMPLANT
DERMABOND ADVANCED (GAUZE/BANDAGES/DRESSINGS) ×1
DERMABOND ADVANCED .7 DNX12 (GAUZE/BANDAGES/DRESSINGS) IMPLANT
DRAPE LAPAROSCOPIC ABDOMINAL (DRAPES) IMPLANT
DRAPE LAPAROTOMY 100X72 PEDS (DRAPES) ×1 IMPLANT
DRSG PAD ABDOMINAL 8X10 ST (GAUZE/BANDAGES/DRESSINGS) ×1 IMPLANT
ELECT REM PT RETURN 9FT ADLT (ELECTROSURGICAL) ×2
ELECTRODE REM PT RTRN 9FT ADLT (ELECTROSURGICAL) ×1 IMPLANT
GAUZE SPONGE 4X4 12PLY STRL (GAUZE/BANDAGES/DRESSINGS) ×1 IMPLANT
GLOVE BIO SURGEON STRL SZ 6 (GLOVE) ×2 IMPLANT
GLOVE INDICATOR 6.5 STRL GRN (GLOVE) ×2 IMPLANT
GOWN STRL REUS W/ TWL LRG LVL3 (GOWN DISPOSABLE) ×2 IMPLANT
GOWN STRL REUS W/TWL LRG LVL3 (GOWN DISPOSABLE) ×2
KIT BASIN OR (CUSTOM PROCEDURE TRAY) ×2 IMPLANT
KIT TURNOVER KIT B (KITS) ×2 IMPLANT
NDL HYPO 25GX1X1/2 BEV (NEEDLE) IMPLANT
NEEDLE HYPO 25GX1X1/2 BEV (NEEDLE) ×2 IMPLANT
NS IRRIG 1000ML POUR BTL (IV SOLUTION) ×2 IMPLANT
PACK GENERAL/GYN (CUSTOM PROCEDURE TRAY) ×2 IMPLANT
PACK LITHOTOMY IV (CUSTOM PROCEDURE TRAY) IMPLANT
PAD ABD 8X10 STRL (GAUZE/BANDAGES/DRESSINGS) ×1 IMPLANT
PAD ARMBOARD 7.5X6 YLW CONV (MISCELLANEOUS) ×2 IMPLANT
PENCIL SMOKE EVACUATOR (MISCELLANEOUS) ×2 IMPLANT
SUT ETHILON 3 0 FSL (SUTURE) ×1 IMPLANT
SUT MNCRL AB 4-0 PS2 18 (SUTURE) ×1 IMPLANT
SUT VIC AB 3-0 SH 27 (SUTURE) ×1
SUT VIC AB 3-0 SH 27X BRD (SUTURE) IMPLANT
SWAB COLLECTION DEVICE MRSA (MISCELLANEOUS) IMPLANT
SWAB CULTURE ESWAB REG 1ML (MISCELLANEOUS) IMPLANT
SYR CONTROL 10ML LL (SYRINGE) ×1 IMPLANT
TOWEL GREEN STERILE (TOWEL DISPOSABLE) ×2 IMPLANT
TOWEL GREEN STERILE FF (TOWEL DISPOSABLE) ×2 IMPLANT
UNDERPAD 30X30 INCONTINENT (UNDERPADS AND DIAPERS) ×2 IMPLANT

## 2018-08-10 NOTE — Anesthesia Procedure Notes (Signed)
Procedure Name: LMA Insertion Date/Time: 08/10/2018 7:36 AM Performed by: Alain Marion, CRNA Pre-anesthesia Checklist: Patient identified, Emergency Drugs available, Suction available and Patient being monitored Patient Re-evaluated:Patient Re-evaluated prior to induction Oxygen Delivery Method: Circle System Utilized Preoxygenation: Pre-oxygenation with 100% oxygen Induction Type: IV induction Ventilation: Mask ventilation without difficulty LMA: LMA inserted LMA Size: 4.0 Tube size: 4.0 mm Number of attempts: 1 Airway Equipment and Method: Bite block Placement Confirmation: positive ETCO2 Tube secured with: Tape Dental Injury: Teeth and Oropharynx as per pre-operative assessment

## 2018-08-10 NOTE — Anesthesia Postprocedure Evaluation (Signed)
Anesthesia Post Note  Patient: Ashley Stanton  Procedure(s) Performed: EXCISION OF LEFT AXILLARY HIDRADENITIS (Left Axilla)     Patient location during evaluation: PACU Anesthesia Type: General Level of consciousness: awake and alert Pain management: pain level controlled Vital Signs Assessment: post-procedure vital signs reviewed and stable Respiratory status: spontaneous breathing, nonlabored ventilation and respiratory function stable Cardiovascular status: blood pressure returned to baseline and stable Postop Assessment: no apparent nausea or vomiting Anesthetic complications: no    Last Vitals:  Vitals:   08/10/18 0903 08/10/18 0918  BP: 119/73 111/65  Pulse: 89 85  Resp: (!) 24 (!) 22  Temp:    SpO2: 100% 100%    Last Pain:  Vitals:   08/10/18 0941  TempSrc:   PainSc: Hyder

## 2018-08-10 NOTE — H&P (Signed)
Ashley Stanton DOB: 31-Dec-1985 Single / Language: [place] / Race: Black or African American Female  History of Present Illness  Patient words: She returns to discuss scheduling axillary hidradenitis excision. She has had several flares since I last saw her in March. She has continuous drainage on the left side. She has also started to develop some lesions in her groin. She is ready to go ahead with scheduling surgery.     Allergies  No Known Allergies   Medication History  Doxycycline Hyclate (100MG  Tablet, Oral) Active. valACYclovir HCl (1GM Tablet, Oral) Active. Medications Reconciled     Review of Systems  All other systems negative  Vitals:   08/10/18 0610  BP: 111/69  Pulse: 90  Resp: 20  Temp: 98.5 F (36.9 C)  SpO2: 100%       Physical Exam   The physical exam findings are as follows: Note:Gen: alert and well appearing Eye: extraocular motion intact, no scleral icterus ENT: moist mucus membranes, dentition intact Neck: no mass or thyromegaly Chest: unlabored respirations, symmetrical air entry, clear bilaterally CV: regular rate and rhythm, no pedal edema Abdomen: soft, nontender, nondistended. No mass or organomegaly MSK: strength symmetrical throughout, no deformity Neuro: grossly intact, normal gait Psych: normal mood and affect, appropriate insight Skin: warm and dry. In the right axilla there were couple of areas of induration and scarring but no active fluctuance or inflammation. In the left axilla along the crease there is approximately a 8 x 3 cm strip of chronic induration with a couple draining areas. She also has some mild dermatitis inferior to the lower aspect of the hairline which she states is from wearing a "sweat vest" for weight loss    Assessment & Plan   HIDRADENITIS AXILLARIS (L73.2) Story: We discussed at her last visit the pathophysiology of this disease and treatment options including -brewers yeast free  diet, -no shave, no chemical deodorants, keeping the area clean and dry, -topical treatments such as clindamycin/retinoin, -suppressive oral antibiotics, -systemic immunotherapy, (arranged by dermatology) -and finally surgery which ranges from local unroofing to wide excision with rotational flap coverage would be the only way to ensure no further flares, we discussed that this is a large surgery requiring several weeks of wound healing, with risk of bleeding, infection, pain, scarring and nearly 100% issue with wound healing. Discussed that would only do one side at a time.   She is considered the above and is now wishing to proceed with surgical excision.

## 2018-08-10 NOTE — Transfer of Care (Signed)
Immediate Anesthesia Transfer of Care Note  Patient: Ashley Stanton  Procedure(s) Performed: EXCISION OF LEFT AXILLARY HIDRADENITIS (Left Axilla)  Patient Location: PACU  Anesthesia Type:General  Level of Consciousness: awake, alert  and oriented  Airway & Oxygen Therapy: Patient Spontanous Breathing and Patient connected to face mask oxygen  Post-op Assessment: Report given to RN and Post -op Vital signs reviewed and stable  Post vital signs: Reviewed and stable  Last Vitals:  Vitals Value Taken Time  BP 96/63 08/10/18 0848  Temp    Pulse 100 08/10/18 0849  Resp 21 08/10/18 0849  SpO2 100 % 08/10/18 0849  Vitals shown include unvalidated device data.  Last Pain:  Vitals:   08/10/18 0610  TempSrc: Oral  PainSc: 0-No pain         Complications: No apparent anesthesia complications

## 2018-08-10 NOTE — Op Note (Signed)
Operative Note  Ashley Stanton  [id number]  [id number]  08/10/2018   Surgeon: Vikki Ports A ConnorMD  Assistant: none  Procedure performed: excision left axillary hidradenitis skin and subcutaneous tissue 10x4cm  Preop diagnosis: hidradenitis Post-op diagnosis/intraop findings: same  Specimens: left axillary hidradenitis Retained items: no EBL: minimal cc Complications: none  Description of procedure: After obtaining informed consent the patient was taken to the operating room and placed supine on operating room table wheregeneral LMA anesthesia was initiated, preoperative antibiotics were administered, SCDs applied, and a formal timeout was performed.  The left axilla was prepped and draped in usual sterile fashion.  After infiltration with Exparel, an elliptical incision was made around the areas of chronic and active inflammation.  Cautery was then used to dissect through the dermis and subcutaneous tissue to excise all of the inflamed and scarred tissue, leaving a wound approximately 10 cm Hersman by 40 m wide by 2 cm deep.  Hemostasis was ensured within the wound.  This was then closed with interrupted deep dermal 3-0 Vicryls and vertical mattress 3-0 nylons.  A dry dressing was applied.  The patient was then awakened, extubated and taken to PACU in stable condition.   All counts were correct at the completion of the case.

## 2018-08-10 NOTE — Discharge Instructions (Signed)
GENERAL SURGERY: POST OP INSTRUCTIONS  ######################################################################  EAT Gradually transition to a high fiber diet with a fiber supplement over the next few weeks after discharge.  Start with a pureed / full liquid diet (see below)  WALK Walk an hour a day.  Control your pain to do that.    CONTROL PAIN Control pain so that you can walk, sleep, tolerate sneezing/coughing, go up/down stairs.  HAVE A BOWEL MOVEMENT DAILY Keep your bowels regular to avoid problems.  OK to try a laxative to override constipation.  OK to use an antidairrheal to slow down diarrhea.  Call if not better after 2 tries  CALL IF YOU HAVE PROBLEMS/CONCERNS Call if you are still struggling despite following these instructions. Call if you have concerns not answered by these instructions  ######################################################################    1. DIET: Follow a light bland diet & liquids the first 24 hours after arrival home, such as soup, liquids, starches, etc.  Be sure to drink plenty of fluids.  Quickly advance to a usual solid diet within a few days.  Avoid fast food or heavy meals as your are more likely to get nauseated or have irregular bowels.  A low-fat, high-fiber diet for the rest of your life is ideal.   2. Take your usually prescribed home medications unless otherwise directed. 3. PAIN CONTROL: a. Pain is best controlled by a usual combination of three different methods TOGETHER: i. Ice/Heat ii. Over the counter pain medication iii. Prescription pain medication b. Most patients will experience some swelling and bruising around the incisions.  Ice packs or heating pads (30-60 minutes up to 6 times a day) will help. Use ice for the first few days to help decrease swelling and bruising, then switch to heat to help relax tight/sore spots and speed recovery.  Some people prefer to use ice alone, heat alone, alternating between ice & heat.   Experiment to what works for you.  Swelling and bruising can take several weeks to resolve.   c. It is helpful to take an over-the-counter pain medication regularly for the first few weeks.  Choose one of the following that works best for you: i. Naproxen (Aleve, etc)  Two 220mg tabs twice a day ii. Ibuprofen (Advil, etc) Three 200mg tabs four times a day (every meal & bedtime) iii. Acetaminophen (Tylenol, etc) 500-650mg four times a day (every meal & bedtime) d. A  prescription for pain medication (such as oxycodone, hydrocodone, etc) should be given to you upon discharge.  Take your pain medication as prescribed.  i. If you are having problems/concerns with the prescription medicine (does not control pain, nausea, vomiting, rash, itching, etc), please call us [phone] to see if we need to switch you to a different pain medicine that will work better for you and/or control your side effect better. ii. If you need a refill on your pain medication, please contact your pharmacy.  They will contact our office to request authorization. Prescriptions will not be filled after 5 pm or on week-ends. 4. Avoid getting constipated.  Between the surgery and the pain medications, it is common to experience some constipation.  Increasing fluid intake and taking a fiber supplement (such as Metamucil, Citrucel, FiberCon, MiraLax, etc) 1-2 times a day regularly will usually help prevent this problem from occurring.  A mild laxative (prune juice, Milk of Magnesia, MiraLax, etc) should be taken according to package directions if there are no bowel movements after 48 hours.   5. Wash /   shower every day starting post-op day 2.  No rubbing, scrubbing, lotions or ointments to incisions. Let the soap and water run over the area and pat dry.  6. Remove your bandages 2 days after surgery.  You may leave the incision open to air, but likely will have some drainage from the area. Ok to keep a dry gauze over the incision to  protect your clothes.      7. ACTIVITIES as tolerated:   a. You may resume regular (light) daily activities beginning the next day--such as daily self-care, walking, climbing stairs--gradually increasing activities as tolerated.  If you can walk 30 minutes without difficulty, it is safe to try more intense activity such as jogging, treadmill, bicycling, low-impact aerobics, swimming, etc. b. Save the most intensive and strenuous activity for last such as sit-ups, heavy lifting, contact sports, etc  Refrain from any heavy lifting or straining until you are off narcotics for pain control.   c. DO NOT PUSH THROUGH PAIN.  Let pain be your guide: If it hurts to do something, don't do it.  Pain is your body warning you to avoid that activity for another week until the pain goes down. d. You may drive when you are no longer taking prescription pain medication, you can comfortably wear a seatbelt, and you can safely maneuver your car and apply brakes. e. Dennis Bast may have sexual intercourse when it is comfortable.  8. FOLLOW UP in our office a. Please call [facility] at [phone] to set up an appointment to see your surgeon in the office for a follow-up appointment approximately 2-3 weeks after your surgery. b. Make sure that you call for this appointment the day you arrive home to insure a convenient appointment time. 9. IF YOU HAVE DISABILITY OR FAMILY LEAVE FORMS, BRING THEM TO THE OFFICE FOR PROCESSING.  DO NOT GIVE THEM TO YOUR DOCTOR.   WHEN TO CALL us [phone]: 1. Poor pain control 2. Reactions / problems with new medications (rash/itching, nausea, etc)  3. Fever over 101.5 F (38.5 C) 4. Worsening swelling or bruising 5. Continued bleeding from incision. 6. Increased pain, redness, or drainage from the incision 7. Difficulty breathing / swallowing   The clinic staff is available to answer your questions during regular business hours (8:30am-5pm).  Please dont hesitate to call and ask to  speak to one of our nurses for clinical concerns.   If you have a medical emergency, go to the nearest emergency room or call 911.  A surgeon from [facility] is always on call at the [facility], [place], [place], [place], [place]  [address] ? MAIN: [phone] ? TOLL FREE: [phone] ?  FAX [phone] [email or web address]

## 2018-08-11 ENCOUNTER — Encounter (HOSPITAL_COMMUNITY): Payer: Self-pay | Admitting: Surgery

## 2018-08-22 ENCOUNTER — Encounter: Payer: Self-pay | Admitting: Family Medicine

## 2018-09-13 ENCOUNTER — Other Ambulatory Visit: Payer: Self-pay

## 2018-09-13 ENCOUNTER — Encounter: Payer: Self-pay | Admitting: Family Medicine

## 2018-09-13 ENCOUNTER — Ambulatory Visit: Payer: BC Managed Care – PPO | Admitting: Family Medicine

## 2018-09-13 VITALS — BP 126/82 | HR 90 | Temp 98.8°F | Resp 14 | Ht 63.0 in | Wt 256.0 lb

## 2018-09-13 DIAGNOSIS — Z6841 Body Mass Index (BMI) 40.0 and over, adult: Secondary | ICD-10-CM

## 2018-09-13 NOTE — Assessment & Plan Note (Signed)
Restart saxenda discussed diet and use of med Start back at 0.6mg  and titrate back up F/u 2 months for recheck She did have good success with first round

## 2018-09-13 NOTE — Patient Instructions (Addendum)
Start saxenda:  Week 1: 0.6mg injected daily:   Week 2:  1.2 mg injected daily  Week 3:  1.8mg injected daily  Week 4:  2.4mg injected daily  Week 5:  3mg injected daily F/U 2 months  

## 2018-09-13 NOTE — Progress Notes (Signed)
   Subjective:    Patient ID: Ashley Stanton, female    DOB: 1985/10/04, 33 y.o.   MRN: [id number]  Patient presents for Follow-up (Saxenda- had issue with medication D/T surgery)   Pt here to f/u Saxenda, started in June   She had surgery on left axilla for hidradentitis July 7th, doing well   off pain medications   She was started in June, but had to stop Saxenda due to her surgery.    Weight was down to 248lbs at pre-op for surgery  she now back to at work and wants to restart medication    No new concerns    Review Of Systems:  GEN- denies fatigue, fever, weight loss,weakness, recent illness HEENT- denies eye drainage, change in vision, nasal discharge, CVS- denies chest pain, palpitations RESP- denies SOB, cough, wheeze ABD- denies N/V, change in stools, abd pain GU- denies dysuria, hematuria, dribbling, incontinence MSK- denies joint pain, muscle aches, injury Neuro- denies headache, dizziness, syncope, seizure activity       Objective:    BP 126/82   Pulse 90   Temp 98.8 F (37.1 C) (Other (Comment))   Resp 14   Ht 5\' 3"  (1.6 m)   Wt 256 lb (116.1 kg)   SpO2 97%   BMI 45.35 kg/m  GEN- NAD, alert and oriented x3 HEENT- PERRL, EOMI, non injected sclera, pink conjunctiva, MMM, oropharynx clear Neck- Supple, no thyromegaly CVS- RRR, no murmur RESP-CTAB Skin- left axilla healing incision,no drainage,no erythema  EXT- No edema Pulses- Radial, 2+        Assessment & Plan:      Problem List Items Addressed This Visit      Unprioritized   Obesity - Primary    Restart saxenda discussed diet and use of med Start back at 0.6mg  and titrate back up F/u 2 months for recheck She did have good success with first round         Note: This dictation was prepared with Dragon dictation along with smaller phrase technology. Any transcriptional errors that result from this process are unintentional.

## 2018-11-12 ENCOUNTER — Telehealth: Payer: Self-pay | Admitting: *Deleted

## 2018-11-12 MED ORDER — SAXENDA 18 MG/3ML ~~LOC~~ SOPN: 0.6000 mg | PEN_INJECTOR | Freq: Every day | SUBCUTANEOUS | 3 refills | Status: DC

## 2018-11-12 NOTE — Telephone Encounter (Signed)
Your information has been submitted to [employer]. To check for an updated outcome later, reopen this PA request from your dashboard. If [employer] has not responded to your request within 24 hours, contact [employer] at [phone]. If you think there may be a problem with your PA request, use our live chat feature at the bottom right.    

## 2018-11-12 NOTE — Telephone Encounter (Signed)
Received PA determination.   PA approved 11/12/2018- 11/12/2019.

## 2018-11-12 NOTE — Addendum Note (Signed)
Addended by: Sheral Flow on: 11/12/2018 02:23 PM   Modules accepted: Orders

## 2018-11-12 NOTE — Telephone Encounter (Signed)
Received request from pharmacy for PA on Saxenda.   PA submitted.   Dx: E66.9- obesity. 

## 2018-11-15 ENCOUNTER — Ambulatory Visit: Payer: BC Managed Care – PPO | Admitting: Family Medicine

## 2018-12-10 ENCOUNTER — Ambulatory Visit: Payer: BC Managed Care – PPO | Admitting: Family Medicine

## 2019-01-11 ENCOUNTER — Ambulatory Visit (INDEPENDENT_AMBULATORY_CARE_PROVIDER_SITE_OTHER): Payer: BC Managed Care – PPO | Admitting: Family Medicine

## 2019-01-11 ENCOUNTER — Encounter: Payer: Self-pay | Admitting: Family Medicine

## 2019-01-11 ENCOUNTER — Other Ambulatory Visit: Payer: Self-pay

## 2019-01-11 VITALS — BP 128/68 | HR 98 | Temp 98.2°F | Resp 14 | Ht 63.0 in | Wt 252.0 lb

## 2019-01-11 DIAGNOSIS — B351 Tinea unguium: Secondary | ICD-10-CM | POA: Diagnosis not present

## 2019-01-11 DIAGNOSIS — H1031 Unspecified acute conjunctivitis, right eye: Secondary | ICD-10-CM | POA: Diagnosis not present

## 2019-01-11 DIAGNOSIS — Z6841 Body Mass Index (BMI) 40.0 and over, adult: Secondary | ICD-10-CM

## 2019-01-11 DIAGNOSIS — J302 Other seasonal allergic rhinitis: Secondary | ICD-10-CM

## 2019-01-11 DIAGNOSIS — T7840XA Allergy, unspecified, initial encounter: Secondary | ICD-10-CM | POA: Insufficient documentation

## 2019-01-11 MED ORDER — TERBINAFINE HCL 250 MG PO TABS: 250.0000 mg | ORAL_TABLET | Freq: Every day | ORAL | 2 refills | Status: DC

## 2019-01-11 MED ORDER — POLYMYXIN B-TRIMETHOPRIM 10000-0.1 UNIT/ML-% OP SOLN: 1.0000 [drp] | Freq: Four times a day (QID) | OPHTHALMIC | 0 refills | Status: DC

## 2019-01-11 MED ORDER — CHANTIX STARTING MONTH PAK 0.5 MG X 11 & 1 MG X 42 PO TABS: ORAL_TABLET | ORAL | 0 refills | Status: DC

## 2019-01-11 NOTE — Assessment & Plan Note (Signed)
She will plan to follow-up with her surgeon about bariatric surgery of some type.  She does not want to restart the Saxenda at this time.  Gust some healthy options that she still can proceed with until she has a surgery.

## 2019-01-11 NOTE — Assessment & Plan Note (Signed)
She has seasonal allergies but also has alpha gal.  We will have her take Zyrtec consistently she does not use nasal sprays.  For the eyes looks like she has conjunctivitis though the left eye has been draining since her Bell's palsy.  The right eye is acute we will have her use Polytrim drops.

## 2019-01-11 NOTE — Patient Instructions (Addendum)
We will send lab results  Take zyrtec  Use eye drop  F/U 4 months

## 2019-01-11 NOTE — Progress Notes (Signed)
Subjective:    Patient ID: Ashley Stanton, female    DOB: 22-Feb-1985, 33 y.o.   MRN: [id number]  Patient presents for Follow-up (is not fasting), Eye Irritation (thinks it's allergies- R eye itchy and puffy), and Nail Issues (itching under nail)  Pt here to f/u chronic medical problems.   Medications reviewed   Obesity- she was on saxenda she stopped taking , she has not been consistent, she is interested in weight loss surgery, Dr Doylene Canard her previous surgeon for the hidradentitis is willing to take as a patient, she has to complete the seminar first.   Tobacco use- she has not started on the Chantix, seen by smoking cessation nurse with work, she plans to start in January   Left eye drains daily for  > 1 year since she had the bell's palsy, , always has yellow discharge in the corner,   The right eye had swelling of the upper lid, itching  And sorness in the corner. She has been scratching more She did try zyrtec that helped a little  She felt more like allergies even at work when air was flowing on her head  occ has itchy throat, occ cough,  Has post nasal drip  She is not using any nasal spray  Left foot- great toe nail had nail fungus, she did not complete the terbinafine, still has thickening and some yellow discoloration from mid nail, also itches beneath the nail   Review Of Systems:  GEN- denies fatigue, fever, weight loss,weakness, recent illness HEENT- denies eye drainage, change in vision, nasal discharge, CVS- denies chest pain, palpitations RESP- denies SOB, cough, wheeze ABD- denies N/V, change in stools, abd pain GU- denies dysuria, hematuria, dribbling, incontinence MSK- denies joint pain, muscle aches, injury Neuro- denies headache, dizziness, syncope, seizure activity       Objective:    BP 128/68   Pulse 98   Temp 98.2 F (36.8 C) (Temporal)   Resp 14   Ht 5\' 3"  (1.6 m)   Wt 252 lb (114.3 kg)   SpO2 100%   BMI 44.64 kg/m  GEN- NAD, alert and oriented  x3 HEENT- PERRL, EOMI, non injected sclera, pink conjunctiva, MMM, oropharynx clear, mild erythema right conjunctiva, discharge bilat corners of eyes, nares clear rhinorrhea, no maxillary sinus tenderness Neck- Supple, no thyromegaly CVS- RRR, no murmur RESP-CTAB ABD-NABS,soft,NT,ND EXT- No edema Skin- nail polish on nails but thickened ridge mid great toenail, mild brittle appearance beneath nail Pulses- Radial, DP- 2+        Assessment & Plan:      Problem List Items Addressed This Visit      Unprioritized   Obesity    She will plan to follow-up with her surgeon about bariatric surgery of some type.  She does not want to restart the Saxenda at this time.  Gust some healthy options that she still can proceed with until she has a surgery.      Seasonal allergies    She has seasonal allergies but also has alpha gal.  We will have her take Zyrtec consistently she does not use nasal sprays.  For the eyes looks like she has conjunctivitis though the left eye has been draining since her Bell's palsy.  The right eye is acute we will have her use Polytrim drops.       Other Visit Diagnoses    Acute bacterial conjunctivitis of right eye    -  Primary   Relevant Medications  terbinafine (LAMISIL) 250 MG tablet   Onychomycosis       Start terbinafine.  Liver function checked today she was given lab order she can draw this at her job in 6 weeks   Relevant Medications   terbinafine (LAMISIL) 250 MG tablet   Other Relevant Orders   Comprehensive metabolic panel   [facility] Lab collect      Note: This dictation was prepared with Dragon dictation along with smaller phrase technology. Any transcriptional errors that result from this process are unintentional.

## 2019-01-12 LAB — COMPREHENSIVE METABOLIC PANEL
AG Ratio: 1.1 (calc) (ref 1.0–2.5)
ALT: 15 U/L (ref 6–29)
AST: 16 U/L (ref 10–30)
Albumin: 3.6 g/dL (ref 3.6–5.1)
Alkaline phosphatase (APISO): 72 U/L (ref 31–125)
BUN: 8 mg/dL (ref 7–25)
CO2: 23 mmol/L (ref 20–32)
Calcium: 9.1 mg/dL (ref 8.6–10.2)
Chloride: 107 mmol/L (ref 98–110)
Creat: 0.95 mg/dL (ref 0.50–1.10)
Globulin: 3.4 g/dL (calc) (ref 1.9–3.7)
Glucose, Bld: 90 mg/dL (ref 65–99)
Potassium: 3.9 mmol/L (ref 3.5–5.3)
Sodium: 139 mmol/L (ref 135–146)
Total Bilirubin: 0.2 mg/dL (ref 0.2–1.2)
Total Protein: 7 g/dL (ref 6.1–8.1)

## 2019-01-31 ENCOUNTER — Ambulatory Visit: Payer: BC Managed Care – PPO | Attending: Internal Medicine

## 2019-01-31 ENCOUNTER — Encounter: Payer: Self-pay | Admitting: Family Medicine

## 2019-01-31 DIAGNOSIS — Z20822 Contact with and (suspected) exposure to covid-19: Secondary | ICD-10-CM

## 2019-01-31 MED ORDER — CLINDAMYCIN HCL 300 MG PO CAPS: 300.0000 mg | ORAL_CAPSULE | Freq: Three times a day (TID) | ORAL | 0 refills | Status: DC

## 2019-02-02 LAB — NOVEL CORONAVIRUS, NAA: SARS-CoV-2, NAA: NOT DETECTED

## 2019-02-07 MED ORDER — FLUCONAZOLE 150 MG PO TABS: 150.0000 mg | ORAL_TABLET | Freq: Once | ORAL | 0 refills | Status: AC

## 2019-02-07 NOTE — Addendum Note (Signed)
Addended by: Phillips Odor on: 02/07/2019 12:09 PM   Modules accepted: Orders

## 2019-03-22 ENCOUNTER — Telehealth: Payer: Self-pay | Admitting: Obstetrics and Gynecology

## 2019-03-22 NOTE — Telephone Encounter (Signed)
Noted. Will order to arrive by apt date/time. 

## 2019-03-22 NOTE — Telephone Encounter (Signed)
Patient is schedule for 04/15/19 at 2 pm for annual and nexplanon replacement. Patient reports having spotting would like it replaced

## 2019-03-28 NOTE — Telephone Encounter (Signed)
Patient is reschedule to 04/21/19 with SDJ

## 2019-04-04 NOTE — Telephone Encounter (Signed)
Noted  

## 2019-04-15 ENCOUNTER — Ambulatory Visit: Payer: BC Managed Care – PPO | Admitting: Obstetrics and Gynecology

## 2019-04-15 NOTE — Telephone Encounter (Signed)
Nexplanon reserved for this patient. 

## 2019-04-21 ENCOUNTER — Encounter: Payer: Self-pay | Admitting: Obstetrics and Gynecology

## 2019-04-21 ENCOUNTER — Other Ambulatory Visit: Payer: Self-pay

## 2019-04-21 ENCOUNTER — Ambulatory Visit (INDEPENDENT_AMBULATORY_CARE_PROVIDER_SITE_OTHER): Payer: BC Managed Care – PPO | Admitting: Obstetrics and Gynecology

## 2019-04-21 ENCOUNTER — Other Ambulatory Visit (HOSPITAL_COMMUNITY)
Admission: RE | Admit: 2019-04-21 | Discharge: 2019-04-21 | Disposition: A | Discharge status: Home / Self Care | Payer: BC Managed Care – PPO | Source: Ambulatory Visit | Attending: Obstetrics and Gynecology | Admitting: Obstetrics and Gynecology

## 2019-04-21 VITALS — BP 122/74 | Ht 63.0 in | Wt 243.0 lb

## 2019-04-21 DIAGNOSIS — F17210 Nicotine dependence, cigarettes, uncomplicated: Secondary | ICD-10-CM | POA: Diagnosis not present

## 2019-04-21 DIAGNOSIS — Z01419 Encounter for gynecological examination (general) (routine) without abnormal findings: Secondary | ICD-10-CM | POA: Diagnosis present

## 2019-04-21 DIAGNOSIS — Z1331 Encounter for screening for depression: Secondary | ICD-10-CM

## 2019-04-21 DIAGNOSIS — Z113 Encounter for screening for infections with a predominantly sexual mode of transmission: Secondary | ICD-10-CM | POA: Diagnosis not present

## 2019-04-21 DIAGNOSIS — Z1339 Encounter for screening examination for other mental health and behavioral disorders: Secondary | ICD-10-CM

## 2019-04-21 NOTE — Progress Notes (Signed)
Gynecology Annual Exam  PCP: Salley Scarlet, MD  Chief Complaint  Patient presents with  . Annual Exam   History of Present Illness:  Ms. Ashley Stanton is a 34 y.o. [id number] who LMP was No LMP recorded., presents today for her annual examination.  Her menses are infrequent with Nexplanon. Nexplanon placed 02/2017.   She is sexually active. Uses Nexplanon.  Last Pap: 08/01/2016  Results were: no abnormalities /neg HPV DNA negative Hx of STDs: trichomonas, 2017  There is no FH of breast cancer. There is no FH of ovarian cancer. The patient does not self-breast exams.  Tobacco use: uses 1/2 ppd. She has been taking Chantix to quit.  Alcohol use: social drinker Exercise: She has a Systems analyst now. She has been working out twice a week.   The patient wears seatbelts: no.   The patient reports that domestic violence in her life is absent.   Past Medical History:  Diagnosis Date  . Allergy    SEASONAL  . Bell's palsy 05/08/2014  . Bell's palsy 2016  . Hidradenitis axillaris     Past Surgical History:  Procedure Laterality Date  . CESAREAN SECTION  02/11/2009  . CESAREAN SECTION N/A 06/17/2015   Procedure: REPEAT CESAREAN SECTION;  Surgeon: Conard Novak, MD;  Location: [facility] ORS;  Service: Obstetrics;  Laterality: N/A;  . HYDRADENITIS EXCISION Left 08/10/2018   Procedure: EXCISION OF LEFT AXILLARY HIDRADENITIS;  Surgeon: Berna Bue, MD;  Location: [facility] OR;  Service: General;  Laterality: Left;    Prior to Admission medications   Medication Sig Start Date End Date Taking? Authorizing Provider  etonogestrel (NEXPLANON) 68 MG IMPL implant 1 each by Subdermal route once.   Yes [provider]  SAXENDA 18 MG/3ML SOPN INJECT 0.6MG  INTO THE SKIN DAILY. TITRATE UP AS DIRECTED TO 3MG  DAILY 04/04/19  Yes [provider]  varenicline (CHANTIX STARTING MONTH PAK) 0.5 MG X 11 & 1 MG X 42 tablet Take one 0.5 mg tablet  daily for 3 days, then increase to 0.5 mg tablet  twice daily for 4 days, then increase to1 mg tablet twice daily. 01/11/19  Yes Kennard, 14/8/20, MD  cetirizine (ZYRTEC) 10 MG tablet Take 20 mg by mouth 2 (two) times daily as needed (allergic reaction).    [provider]  clindamycin (CLEOCIN) 300 MG capsule Take 1 capsule (300 mg total) by mouth 3 (three) times daily. Patient not taking: Reported on 04/21/2019 01/31/19   02/02/19, MD  ibuprofen (ADVIL) 200 MG tablet Take 600 mg by mouth every 6 (six) hours as needed for moderate pain.    [provider]  naproxen sodium (ALEVE) 220 MG tablet Take 220 mg by mouth daily as needed (pain).    [provider]  terbinafine (LAMISIL) 250 MG tablet Take 1 tablet (250 mg total) by mouth daily. Patient not taking: Reported on 04/21/2019 01/11/19   14/8/20, MD  trimethoprim-polymyxin b [facility] ophthalmic solution Place 1 drop into both eyes 4 (four) times daily. FOR 1 WEEK Patient not taking: Reported on 04/21/2019 01/11/19   14/8/20, MD  valACYclovir (VALTREX) 500 MG tablet Take 500 mg by mouth daily as needed (fever blisters).    [provider]   Allergies  Allergen Reactions  . Alpha-Gal Hives and Itching  . Pineapple Swelling    Lip swelling    Obstetric History: Salley Scarlet  Social History   Socioeconomic History  . Marital status: Single  Spouse name: Not on file  . Number of children: Not on file  . Years of education: Not on file  . Highest education level: Not on file  Occupational History  . Not on file  Tobacco Use  . Smoking status: Current Every Day Smoker    Packs/day: 0.50    Types: Cigarettes  . Smokeless tobacco: Never Used  Substance and Sexual Activity  . Alcohol use: Yes    Alcohol/week: 2.0 standard drinks    Types: 2 Glasses of wine per week    Comment: every 2 to 3 weeks  . Drug use: No  . Sexual activity: Yes    Birth control/protection: Condom, Implant  Other Topics Concern  . Not on file   Social History Narrative  . Not on file   Social Determinants of Health   Financial Resource Strain:   . Difficulty of Paying Living Expenses:   Food Insecurity:   . Worried About Charity fundraiser in the Last Year:   . Arboriculturist in the Last Year:   Transportation Needs:   . Film/video editor (Medical):   Marland Kitchen Lack of Transportation (Non-Medical):   Physical Activity:   . Days of Exercise per Week:   . Minutes of Exercise per Session:   Stress:   . Feeling of Stress :   Social Connections:   . Frequency of Communication with Friends and Family:   . Frequency of Social Gatherings with Friends and Family:   . Attends Religious Services:   . Active Member of Clubs or Organizations:   . Attends Archivist Meetings:   Marland Kitchen Marital Status:   Intimate Partner Violence:   . Fear of Current or Ex-Partner:   . Emotionally Abused:   Marland Kitchen Physically Abused:   . Sexually Abused:     Family History  Problem Relation Age of Onset  . Miscarriages / [place] Mother   . Alcohol abuse Father   . Asthma Father   . Diabetes Father   . Hypertension Maternal Grandmother   . Stroke Maternal Grandmother   . Heart disease Maternal Grandfather     Review of Systems  Constitutional: Negative.   HENT: Negative.   Eyes: Negative.   Respiratory: Negative.   Cardiovascular: Negative.   Gastrointestinal: Negative.   Genitourinary: Negative.   Musculoskeletal: Negative.   Skin: Negative.   Neurological: Negative.   Psychiatric/Behavioral: Negative.      Physical Exam BP 122/74   Ht 5\' 3"  (1.6 m)   Wt 243 lb (110.2 kg)   BMI 43.05 kg/m    Physical Exam Constitutional:      General: She is not in acute distress.    Appearance: Normal appearance. She is well-developed.  Genitourinary:     Pelvic exam was performed with patient in the lithotomy position.     Vulva, urethra, bladder and uterus normal.     No inguinal adenopathy present in the right or left side.     No signs of injury in the vagina.     No vaginal discharge, erythema, tenderness or bleeding.     No cervical motion tenderness, discharge, lesion or polyp.     Uterus is mobile.     Uterus is not enlarged or tender.     No uterine mass detected.    Uterus is anteverted.     No right or left adnexal mass present.     Right adnexa not tender or full.  Left adnexa not tender or full.     Genitourinary Comments: Bimanual limited by body habitus  HENT:     Head: Normocephalic and atraumatic.  Eyes:     General: No scleral icterus.    Conjunctiva/sclera: Conjunctivae normal.  Neck:     Thyroid: No thyromegaly.  Cardiovascular:     Rate and Rhythm: Normal rate and regular rhythm.     Heart sounds: No murmur. No friction rub. No gallop.   Pulmonary:     Effort: Pulmonary effort is normal. No respiratory distress.     Breath sounds: Normal breath sounds. No wheezing or rales.  Chest:     Breasts:        Right: No inverted nipple, mass, nipple discharge, skin change or tenderness.        Left: No inverted nipple, mass, nipple discharge, skin change or tenderness.  Abdominal:     General: Bowel sounds are normal. There is no distension.     Palpations: Abdomen is soft. There is no mass.     Tenderness: There is no abdominal tenderness. There is no guarding or rebound.  Musculoskeletal:        General: No swelling or tenderness. Normal range of motion.     Cervical back: Normal range of motion and neck supple.  Lymphadenopathy:     Cervical: No cervical adenopathy.     Lower Body: No right inguinal adenopathy. No left inguinal adenopathy.  Neurological:     General: No focal deficit present.     Mental Status: She is alert and oriented to person, place, and time.     Cranial Nerves: No cranial nerve deficit.  Skin:    General: Skin is warm and dry.     Findings: No erythema or rash.  Psychiatric:        Mood and Affect: Mood normal.        Behavior: Behavior normal.         Judgment: Judgment normal.     Female chaperone present for pelvic and breast  portions of the physical exam  Results: AUDIT Questionnaire (screen for alcoholism): 4 PHQ-9: 1   Assessment: 34 y.o. G31P2002 female here for routine annual gynecologic examination  Plan: Problem List Items Addressed This Visit    None    Visit Diagnoses    Women's annual routine gynecological examination    -  Primary   Relevant Orders   Cervicovaginal ancillary only   Screening for alcoholism       Screening for depression       Screen for STD (sexually transmitted disease)       Relevant Orders   Cervicovaginal ancillary only      Screening: -- Blood pressure screen normal -- Weight screening: obese: discussed management options, including lifestyle, dietary, and exercise. -- Depression screening negative (PHQ-9) -- Nutrition: normal -- cholesterol screening: not due for screening -- osteoporosis screening: not due -- tobacco screening: using: discussed quitting using the 5 A's. She is currently taking Chantix.  -- alcohol screening: AUDIT questionnaire indicates low-risk usage. -- family history of breast cancer screening: done. not at high risk. -- no evidence of domestic violence or intimate partner violence. -- STD screening: gonorrhea/chlamydia NAAT collected -- pap smear not collected per ASCCP guidelines -- flu vaccine received  Thomasene Mohair, MD 04/21/2019 3:12 PM

## 2019-04-25 LAB — CERVICOVAGINAL ANCILLARY ONLY
Chlamydia: NEGATIVE
Comment: NEGATIVE
Comment: NEGATIVE
Comment: NORMAL
Neisseria Gonorrhea: NEGATIVE
Trichomonas: NEGATIVE

## 2019-05-13 ENCOUNTER — Ambulatory Visit: Payer: BC Managed Care – PPO | Admitting: Family Medicine

## 2019-05-16 ENCOUNTER — Ambulatory Visit: Payer: BC Managed Care – PPO | Admitting: Family Medicine

## 2019-06-01 ENCOUNTER — Ambulatory Visit: Payer: BC Managed Care – PPO | Admitting: Family Medicine

## 2019-06-04 ENCOUNTER — Other Ambulatory Visit: Payer: Self-pay

## 2019-06-04 ENCOUNTER — Ambulatory Visit: Payer: BC Managed Care – PPO | Attending: Internal Medicine

## 2019-06-04 DIAGNOSIS — Z23 Encounter for immunization: Secondary | ICD-10-CM

## 2019-06-04 NOTE — Progress Notes (Signed)
   Covid-19 Vaccination Clinic  Name:  FOREVER ARECHIGA    MRN: [id number] DOB: 01-29-1986  06/04/2019  Ms. Hilyard was observed post Covid-19 immunization for 15 minutes without incident. She was provided with Vaccine Information Sheet and instruction to access the V-Safe system.   Ms. Chaudhary was instructed to call 911 with any severe reactions post vaccine: Marland Kitchen Difficulty breathing  . Swelling of face and throat  . A fast heartbeat  . A bad rash all over body  . Dizziness and weakness   Immunizations Administered    Name Date Dose VIS Date Route   Pfizer COVID-19 Vaccine 06/04/2019 11:28 AM 0.3 mL 03/30/2018 Intramuscular   Manufacturer: [employer], [employer]   Lot: [id number]   NDC: [address]-3

## 2019-06-08 ENCOUNTER — Ambulatory Visit: Payer: BC Managed Care – PPO | Admitting: Family Medicine

## 2019-06-08 ENCOUNTER — Encounter: Payer: Self-pay | Admitting: Family Medicine

## 2019-06-08 ENCOUNTER — Other Ambulatory Visit: Payer: Self-pay

## 2019-06-08 DIAGNOSIS — R7303 Prediabetes: Secondary | ICD-10-CM | POA: Diagnosis not present

## 2019-06-08 DIAGNOSIS — Z6841 Body Mass Index (BMI) 40.0 and over, adult: Secondary | ICD-10-CM | POA: Diagnosis not present

## 2019-06-08 NOTE — Progress Notes (Signed)
Subjective:    Patient ID: Ashley Stanton, female    DOB: July 04, 1985, 34 y.o.   MRN: [id number]  Patient presents for Follow-up (is not fastin) and Weight Loss  Obesity-patient here to discuss her weight loss.  Attended the bariatric seminar and needs letter recommendation forms completed.  She still considering other options and would like to work on overall nutrition but then she does not like to have a change her whole diet because she enjoys certain foods. She was working out with a Psychologist, educational doing a low-carb diet she lost 8 pounds before her trip to [place] been she gained 10 pounds back.  She admitted that she had many different comfort foods and overate when she was in the 33s and never got back on track.  Now she has not been working out with a trainer at all.    He is considering a nutrition program called [facility] out of [place]     She typically eats a frittata or hot pocket  For breakfast, or protein shake,  Tries to bring lunch or salad, tries to cook dinner ( orders hello fresh)   Low Calorie  Fitness APP minimal weight loss Tried Keto 2019 - lost 10lbs   Phentermine 2017/2018- had SE with meds   Saxenda started 2020   Weight Watchers- she has ongoing membership loss 20lbs less  Recent low-carb diet with trainer lost 8 pounds   She has history of  borderline DM  needs repeat lipids and A1c done today    Review Of Systems:  GEN- denies fatigue, fever, weight loss,weakness, recent illness HEENT- denies eye drainage, change in vision, nasal discharge, CVS- denies chest pain, palpitations RESP- denies SOB, cough, wheeze ABD- denies N/V, change in stools, abd pain GU- denies dysuria, hematuria, dribbling, incontinence MSK- denies joint pain, muscle aches, injury Neuro- denies headache, dizziness, syncope, seizure activity       Objective:    BP 128/68   Pulse 88   Temp 98.7 F (37.1 C) (Temporal)   Resp 14   Ht 5\' 3"  (1.6 m)   Wt 255 lb (115.7 kg)   SpO2 98%    BMI 45.17 kg/m  GEN- NAD, alert and oriented x3 HEENT- PERRL, EOMI, non injected sclera, pink conjunctiva, MMM, oropharynx clear Neck- Supple, no thyromegaly CVS- RRR, no murmur RESP-CTAB ABD-NABS,soft,NT,ND EXT- No edema Pulses- Radial,  2+        Assessment & Plan:      Problem List Items Addressed This Visit      Unprioritized   Borderline diabetes   Relevant Orders   Comprehensive metabolic panel   CBC with Differential/Platelet   Hemoglobin A1c   Lipid panel   Ambulatory referral to General Surgery   Obesity - Primary    Does her overall mindset needed getting along with her health goals.  She is going to check into the nutrition program in [place].  She would still like to see go forward with consultation with the surgeon so letter recommendation will be written and forms completed for her.  Discussed going back on the lower carb diet as this did work fairly well for her.  She is also warned a meal service twice a week.  We will check her labs today.      Relevant Orders   Comprehensive metabolic panel   CBC with Differential/Platelet   Hemoglobin A1c   TSH   Ambulatory referral to General Surgery      Note: This dictation was  prepared with Dragon dictation along with smaller phrase technology. Any transcriptional errors that result from this process are unintentional.

## 2019-06-08 NOTE — Assessment & Plan Note (Signed)
Does her overall mindset needed getting along with her health goals.  She is going to check into the nutrition program in [place].  She would still like to see go forward with consultation with the surgeon so letter recommendation will be written and forms completed for her.  Discussed going back on the lower carb diet as this did work fairly well for her.  She is also warned a meal service twice a week.  We will check her labs today.

## 2019-06-08 NOTE — Patient Instructions (Addendum)
Referral to surgeon F/U 4 MONTHS for Physical

## 2019-06-11 LAB — COMPREHENSIVE METABOLIC PANEL
AG Ratio: 1.3 (calc) (ref 1.0–2.5)
ALT: 14 U/L (ref 6–29)
AST: 17 U/L (ref 10–30)
Albumin: 3.8 g/dL (ref 3.6–5.1)
Alkaline phosphatase (APISO): 70 U/L (ref 31–125)
BUN: 10 mg/dL (ref 7–25)
CO2: 26 mmol/L (ref 20–32)
Calcium: 9.3 mg/dL (ref 8.6–10.2)
Chloride: 104 mmol/L (ref 98–110)
Creat: 1.06 mg/dL (ref 0.50–1.10)
Globulin: 3 g/dL (calc) (ref 1.9–3.7)
Glucose, Bld: 113 mg/dL — ABNORMAL HIGH (ref 65–99)
Potassium: 4.2 mmol/L (ref 3.5–5.3)
Sodium: 138 mmol/L (ref 135–146)
Total Bilirubin: 0.3 mg/dL (ref 0.2–1.2)
Total Protein: 6.8 g/dL (ref 6.1–8.1)

## 2019-06-11 LAB — LIPID PANEL
Cholesterol: 152 mg/dL (ref <200)
HDL: 53 mg/dL (ref >=50)
LDL Cholesterol (Calc): 74 mg/dL (calc)
Non-HDL Cholesterol (Calc): 99 mg/dL (calc) (ref <130)
Total CHOL/HDL Ratio: 2.9 (calc) (ref <5.0)
Triglycerides: 167 mg/dL — ABNORMAL HIGH (ref <150)

## 2019-06-11 LAB — CBC WITH DIFFERENTIAL/PLATELET
Absolute Monocytes: 334 cells/uL (ref 200–950)
Basophils Absolute: 7 cells/uL (ref 0–200)
Basophils Relative: 0.1 %
Eosinophils Absolute: 71 cells/uL (ref 15–500)
Eosinophils Relative: 1 %
HCT: 39.1 % (ref 35.0–45.0)
Hemoglobin: 12.9 g/dL (ref 11.7–15.5)
Lymphs Abs: 3195 cells/uL (ref 850–3900)
MCH: 28.9 pg (ref 27.0–33.0)
MCHC: 33 g/dL (ref 32.0–36.0)
MCV: 87.5 fL (ref 80.0–100.0)
MPV: 9.7 fL (ref 7.5–12.5)
Monocytes Relative: 4.7 %
Neutro Abs: 3493 cells/uL (ref 1500–7800)
Neutrophils Relative %: 49.2 %
Platelets: 298 10*3/uL (ref 140–400)
RBC: 4.47 10*6/uL (ref 3.80–5.10)
RDW: 12.7 % (ref 11.0–15.0)
Total Lymphocyte: 45 %
WBC: 7.1 10*3/uL (ref 3.8–10.8)

## 2019-06-11 LAB — TEST AUTHORIZATION

## 2019-06-11 LAB — TSH: TSH: 1.86 mIU/L

## 2019-06-11 LAB — HEMOGLOBIN A1C
Hgb A1c MFr Bld: 5.1 % of total Hgb (ref <5.7)
Mean Plasma Glucose: 100 (calc)
eAG (mmol/L): 5.5 (calc)

## 2019-06-17 ENCOUNTER — Encounter: Payer: Self-pay | Admitting: Family Medicine

## 2019-06-28 ENCOUNTER — Ambulatory Visit: Payer: BC Managed Care – PPO

## 2019-06-30 ENCOUNTER — Ambulatory Visit: Payer: BC Managed Care – PPO | Attending: Internal Medicine

## 2019-06-30 DIAGNOSIS — Z23 Encounter for immunization: Secondary | ICD-10-CM

## 2019-06-30 NOTE — Progress Notes (Signed)
   Covid-19 Vaccination Clinic  Name:  Ashley Stanton    MRN: [id number] DOB: 09-25-1985  06/30/2019  Ms. Ran was observed post Covid-19 immunization for 15 minutes without incident. She was provided with Vaccine Information Sheet and instruction to access the V-Safe system.   Ms. Dau was instructed to call 911 with any severe reactions post vaccine: Marland Kitchen Difficulty breathing  . Swelling of face and throat  . A fast heartbeat  . A bad rash all over body  . Dizziness and weakness   Immunizations Administered    Name Date Dose VIS Date Route   Pfizer COVID-19 Vaccine 06/30/2019  2:11 PM 0.3 mL 03/30/2018 Intramuscular   Manufacturer: [employer], [employer]   Lot: [id number]   NDC: [address]-7

## 2019-10-11 ENCOUNTER — Encounter: Payer: BC Managed Care – PPO | Admitting: Family Medicine

## 2019-10-17 ENCOUNTER — Other Ambulatory Visit: Payer: BC Managed Care – PPO

## 2019-11-16 ENCOUNTER — Other Ambulatory Visit: Payer: Self-pay

## 2019-11-16 ENCOUNTER — Encounter: Payer: Self-pay | Admitting: Family Medicine

## 2019-11-16 ENCOUNTER — Ambulatory Visit (INDEPENDENT_AMBULATORY_CARE_PROVIDER_SITE_OTHER): Payer: BC Managed Care – PPO | Admitting: Family Medicine

## 2019-11-16 VITALS — BP 132/64 | HR 90 | Resp 12 | Ht 62.0 in | Wt 254.0 lb

## 2019-11-16 DIAGNOSIS — Z0001 Encounter for general adult medical examination with abnormal findings: Secondary | ICD-10-CM | POA: Diagnosis not present

## 2019-11-16 DIAGNOSIS — Z23 Encounter for immunization: Secondary | ICD-10-CM

## 2019-11-16 DIAGNOSIS — R7303 Prediabetes: Secondary | ICD-10-CM | POA: Diagnosis not present

## 2019-11-16 DIAGNOSIS — N898 Other specified noninflammatory disorders of vagina: Secondary | ICD-10-CM

## 2019-11-16 DIAGNOSIS — Z124 Encounter for screening for malignant neoplasm of cervix: Secondary | ICD-10-CM

## 2019-11-16 DIAGNOSIS — Z6841 Body Mass Index (BMI) 40.0 and over, adult: Secondary | ICD-10-CM

## 2019-11-16 DIAGNOSIS — Z0000 Encounter for general adult medical examination without abnormal findings: Secondary | ICD-10-CM

## 2019-11-16 DIAGNOSIS — L732 Hidradenitis suppurativa: Secondary | ICD-10-CM

## 2019-11-16 LAB — WET PREP FOR TRICH, YEAST, CLUE

## 2019-11-16 MED ORDER — CLINDAMYCIN HCL 300 MG PO CAPS
300.0000 mg | ORAL_CAPSULE | Freq: Three times a day (TID) | ORAL | 0 refills | Status: DC
Start: 2019-11-16 — End: 2020-01-04

## 2019-11-16 MED ORDER — FLUCONAZOLE 150 MG PO TABS: 150.0000 mg | ORAL_TABLET | Freq: Once | ORAL | 0 refills | Status: AC

## 2019-11-16 NOTE — Assessment & Plan Note (Signed)
A1C have been normal Will check fasting insulin level

## 2019-11-16 NOTE — Progress Notes (Signed)
Subjective:    Patient ID: Ashley Stanton, female    DOB: Feb 19, 1985, 34 y.o.   MRN: [id number]  Patient presents for Annual Exam (Pt said that she has a little bump she would like you to look at. Other than that - she denies any new concerns. )   Pt here for annual exam    PAP Smear- Due,l ast 2018    Immunizations- Due for Flu shot, TDAP/ COVID-19 UTD   Nexplanon in place for contraception , expires in 2022 , no menses   Right inner thigh she has a small papular lesion mildly tender is mostly underneath the skin.  She is concerned it was going to turn into a boil   She has had some vaginal discharge , used a new soap recently and it irritated her  Due for fasting labs, last A1C 5.1%  , TG 167 in May   Eye doctor yearly Dentist needs visit    Review Of Systems:  GEN- denies fatigue, fever, weight loss,weakness, recent illness HEENT- denies eye drainage, change in vision, nasal discharge, CVS- denies chest pain, palpitations RESP- denies SOB, cough, wheeze ABD- denies N/V, change in stools, abd pain GU- denies dysuria, hematuria, dribbling, incontinence MSK- denies joint pain, muscle aches, injury Neuro- denies headache, dizziness, syncope, seizure activity       Objective:    BP 132/64   Pulse 90   Resp 12   Ht 5\' 2"  (1.575 m)   Wt 254 lb (115.2 kg)   LMP 07/05/2019   SpO2 99%   BMI 46.46 kg/m  GEN- NAD, alert and oriented x3 HEENT- PERRL, EOMI, non injected sclera, pink conjunctiva, MMM, oropharynx clear Neck- supple, no thyromegaly Breast- normal symmetry, no nipple inversion,no nipple drainage, no nodules or lumps felt Nodes- no axillary nodes CVS- RRR, no murmur RESP-CTAB ABD-NABS,soft,NT,ND GU- normal external genitalia, vaginal mucosa pink and moist, cervix visualized no growth has hypopigmented lesion 8 oclock position , no blood form os, +discharge, no CMT, no ovarian masses, uterus normal size Skin- very small papular lesion with tiny pustule at  center, NT, no fluctance  EXT- No edema Pulses- Radial, DP- 2+        Assessment & Plan:      Problem List Items Addressed This Visit      Unprioritized   Borderline diabetes    A1C have been normal Will check fasting insulin level      Hidradenitis suppurativa    Small boil starting, given clindamycin to have on hand Use warm compresses       Relevant Medications   clindamycin (CLEOCIN) 300 MG capsule   fluconazole (DIFLUCAN) 150 MG tablet   Obesity    She recently returned paperwork to bariatric surgeon She has tried multiple other modalities and meds without any success       Relevant Orders   Insulin, random    Other Visit Diagnoses    Routine general medical examination at a health care facility    -  Primary   CPE done, fasting labs obtained, PAP Smear done, flu shot given    Relevant Orders   CBC with Differential/Platelet   Comprehensive metabolic panel   Lipid panel   Vaginal discharge       Relevant Orders   C. trachomatis/N. gonorrhoeae RNA   WET PREP FOR TRICH, YEAST, CLUE (Completed)   Insulin, random   Cervical cancer screening       Relevant Orders   Pap IG w/  reflex to HPV when ASC-U   Need for immunization against influenza       Relevant Orders   Flu Vaccine QUAD 36+ mos IM (Completed)      Note: This dictation was prepared with Dragon dictation along with smaller phrase technology. Any transcriptional errors that result from this process are unintentional.

## 2019-11-16 NOTE — Patient Instructions (Signed)
F/u 6 MONTHS  We will send results via mychart Flu shot given

## 2019-11-16 NOTE — Assessment & Plan Note (Signed)
She recently returned paperwork to bariatric surgeon She has tried multiple other modalities and meds without any success

## 2019-11-16 NOTE — Assessment & Plan Note (Signed)
Small boil starting, given clindamycin to have on hand Use warm compresses

## 2019-11-17 LAB — CBC WITH DIFFERENTIAL/PLATELET
Absolute Monocytes: 352 cells/uL (ref 200–950)
Basophils Absolute: 22 cells/uL (ref 0–200)
Basophils Relative: 0.4 %
Eosinophils Absolute: 72 cells/uL (ref 15–500)
Eosinophils Relative: 1.3 %
HCT: 39.1 % (ref 35.0–45.0)
Hemoglobin: 13 g/dL (ref 11.7–15.5)
Lymphs Abs: 2552 cells/uL (ref 850–3900)
MCH: 28.8 pg (ref 27.0–33.0)
MCHC: 33.2 g/dL (ref 32.0–36.0)
MCV: 86.5 fL (ref 80.0–100.0)
MPV: 9.9 fL (ref 7.5–12.5)
Monocytes Relative: 6.4 %
Neutro Abs: 2503 cells/uL (ref 1500–7800)
Neutrophils Relative %: 45.5 %
Platelets: 307 10*3/uL (ref 140–400)
RBC: 4.52 10*6/uL (ref 3.80–5.10)
RDW: 12.1 % (ref 11.0–15.0)
Total Lymphocyte: 46.4 %
WBC: 5.5 10*3/uL (ref 3.8–10.8)

## 2019-11-17 LAB — LIPID PANEL
Cholesterol: 159 mg/dL (ref <200)
HDL: 51 mg/dL (ref >=50)
LDL Cholesterol (Calc): 85 mg/dL (calc)
Non-HDL Cholesterol (Calc): 108 mg/dL (calc) (ref <130)
Total CHOL/HDL Ratio: 3.1 (calc) (ref <5.0)
Triglycerides: 133 mg/dL (ref <150)

## 2019-11-17 LAB — COMPREHENSIVE METABOLIC PANEL
AG Ratio: 1.3 (calc) (ref 1.0–2.5)
ALT: 13 U/L (ref 6–29)
AST: 14 U/L (ref 10–30)
Albumin: 3.9 g/dL (ref 3.6–5.1)
Alkaline phosphatase (APISO): 67 U/L (ref 31–125)
BUN: 10 mg/dL (ref 7–25)
CO2: 22 mmol/L (ref 20–32)
Calcium: 9.2 mg/dL (ref 8.6–10.2)
Chloride: 106 mmol/L (ref 98–110)
Creat: 0.91 mg/dL (ref 0.50–1.10)
Globulin: 2.9 g/dL (calc) (ref 1.9–3.7)
Glucose, Bld: 105 mg/dL — ABNORMAL HIGH (ref 65–99)
Potassium: 4.6 mmol/L (ref 3.5–5.3)
Sodium: 137 mmol/L (ref 135–146)
Total Bilirubin: 0.2 mg/dL (ref 0.2–1.2)
Total Protein: 6.8 g/dL (ref 6.1–8.1)

## 2019-11-17 LAB — C. TRACHOMATIS/N. GONORRHOEAE RNA
C. trachomatis RNA, TMA: NOT DETECTED
N. gonorrhoeae RNA, TMA: NOT DETECTED

## 2019-11-17 LAB — INSULIN, RANDOM: Insulin: 36.9 u[IU]/mL — ABNORMAL HIGH

## 2019-11-18 ENCOUNTER — Encounter: Payer: Self-pay | Admitting: Family Medicine

## 2019-11-18 LAB — PAP IG W/ RFLX HPV ASCU

## 2019-11-18 MED ORDER — METFORMIN HCL 500 MG PO TABS: 500.0000 mg | ORAL_TABLET | Freq: Every day | ORAL | 1 refills | Status: DC

## 2019-12-02 ENCOUNTER — Encounter: Payer: BC Managed Care – PPO | Admitting: Family Medicine

## 2020-01-04 ENCOUNTER — Other Ambulatory Visit: Payer: Self-pay

## 2020-01-04 ENCOUNTER — Encounter: Payer: Self-pay | Admitting: Family Medicine

## 2020-01-04 ENCOUNTER — Ambulatory Visit (INDEPENDENT_AMBULATORY_CARE_PROVIDER_SITE_OTHER): Payer: BC Managed Care – PPO | Admitting: Family Medicine

## 2020-01-04 VITALS — BP 128/78 | HR 82 | Temp 97.8°F | Resp 14 | Ht 62.0 in | Wt 255.0 lb

## 2020-01-04 DIAGNOSIS — B9689 Other specified bacterial agents as the cause of diseases classified elsewhere: Secondary | ICD-10-CM

## 2020-01-04 DIAGNOSIS — R300 Dysuria: Secondary | ICD-10-CM

## 2020-01-04 DIAGNOSIS — N760 Acute vaginitis: Secondary | ICD-10-CM

## 2020-01-04 LAB — URINALYSIS, ROUTINE W REFLEX MICROSCOPIC
Bilirubin Urine: NEGATIVE
Glucose, UA: NEGATIVE
Hyaline Cast: NONE SEEN /LPF
Ketones, ur: NEGATIVE
Leukocytes,Ua: NEGATIVE
Nitrite: NEGATIVE
Protein, ur: NEGATIVE
Specific Gravity, Urine: 1.026 (ref 1.001–1.03)
WBC, UA: NONE SEEN /HPF (ref 0–5)
pH: 5.5 (ref 5.0–8.0)

## 2020-01-04 LAB — WET PREP FOR TRICH, YEAST, CLUE

## 2020-01-04 LAB — MICROSCOPIC MESSAGE

## 2020-01-04 MED ORDER — METRONIDAZOLE 500 MG PO TABS: 500.0000 mg | ORAL_TABLET | Freq: Two times a day (BID) | ORAL | 0 refills | Status: DC

## 2020-01-04 MED ORDER — FLUCONAZOLE 150 MG PO TABS: 150.0000 mg | ORAL_TABLET | Freq: Once | ORAL | 0 refills | Status: AC

## 2020-01-04 NOTE — Progress Notes (Signed)
   Subjective:    Patient ID: Ashley Stanton, female    DOB: 04/13/1985, 34 y.o.   MRN: [id number]  Patient presents for Dysuria (burning with urination- not sure if it is urinary or vaginal)   Pt here with dysuria for the past few days. Sat was crampy thought cycle was coming on, she has nexplanon so very irregular  Sunday had dysuria with every urinary episode, felt likek she had a cut in vagina, but she couldn't see anyting, she has noticed discharge as well, no new partners  no N/V vomiting  No sores noted on the skin   she works in a lab, she dipped urine  - had blood in urine  Note Not taking metformin due to loose bowels  She did have bariatric surgery consult today    Review Of Systems:  GEN- denies fatigue, fever, weight loss,weakness, recent illness HEENT- denies eye drainage, change in vision, nasal discharge, CVS- denies chest pain, palpitations RESP- denies SOB, cough, wheeze ABD- denies N/V, change in stools, abd pain GU- denies dysuria, hematuria, dribbling, incontinence MSK- denies joint pain, muscle aches, injury Neuro- denies headache, dizziness, syncope, seizure activity       Objective:    BP 128/78   Pulse 82   Temp 97.8 F (36.6 C) (Temporal)   Resp 14   Ht 5\' 2"  (1.575 m)   Wt 255 lb (115.7 kg)   SpO2 97%   BMI 46.64 kg/m  GEN- NAD, alert and oriented x3 HEENT- PERRL, EOMI, non injected sclera, pink conjunctiva, MMM, oropharynx clear Neck- Supple, no thyromegaly CVS- RRR, no murmur RESP-CTAB ABD-NABS,soft,NT,ND, no CVA tenderness GEN- NAD, alert and oriented, Neck- supple, no thyromegaly Breast- normal symmetry, no nipple inversion,no nipple drainage, no nodules or lumps felt Nodes- no axillary nodes GU- normal external genitalia, mild discharge in vaginal folds, vaginal mucosa pink and moist, cervix visualized no growth, no blood form os, + discharge, no CMT, no ovarian masses, uterus normal size EXT- No edema Pulses- Radial, DP- 2+         Assessment & Plan:      Problem List Items Addressed This Visit    None    Visit Diagnoses    Dysuria    -  Primary   hematuria, no overt UTI, send for culture   Relevant Orders   Urinalysis, Routine w reflex microscopic (Completed)   Urine Culture   BV (bacterial vaginosis)       Treat with flagyl, diflucan after antibiotics   Relevant Medications   metroNIDAZOLE (FLAGYL) 500 MG tablet   fluconazole (DIFLUCAN) 150 MG tablet   Other Relevant Orders   WET PREP FOR TRICH, YEAST, CLUE (Completed)      Note: This dictation was prepared with Dragon dictation along with smaller phrase technology. Any transcriptional errors that result from this process are unintentional.

## 2020-01-04 NOTE — Patient Instructions (Signed)
F/U as needed

## 2020-01-05 ENCOUNTER — Other Ambulatory Visit (HOSPITAL_COMMUNITY): Payer: Self-pay | Admitting: Surgery

## 2020-01-05 ENCOUNTER — Other Ambulatory Visit: Payer: Self-pay | Admitting: Surgery

## 2020-01-05 LAB — URINE CULTURE
MICRO NUMBER:: 11263547
SPECIMEN QUALITY:: ADEQUATE

## 2020-01-25 ENCOUNTER — Ambulatory Visit (HOSPITAL_COMMUNITY): Payer: BC Managed Care – PPO

## 2020-01-25 ENCOUNTER — Other Ambulatory Visit (HOSPITAL_COMMUNITY): Payer: BC Managed Care – PPO

## 2020-02-06 ENCOUNTER — Other Ambulatory Visit: Payer: Self-pay | Admitting: Surgery

## 2020-02-08 ENCOUNTER — Other Ambulatory Visit (HOSPITAL_COMMUNITY): Payer: BC Managed Care – PPO

## 2020-02-08 ENCOUNTER — Ambulatory Visit: Payer: BC Managed Care – PPO | Admitting: Skilled Nursing Facility1

## 2020-02-08 ENCOUNTER — Encounter (HOSPITAL_COMMUNITY): Payer: Self-pay

## 2020-02-20 ENCOUNTER — Encounter: Payer: BC Managed Care – PPO | Attending: Surgery | Admitting: Skilled Nursing Facility1

## 2020-02-20 ENCOUNTER — Ambulatory Visit: Payer: BC Managed Care – PPO | Attending: Family Medicine

## 2020-02-20 ENCOUNTER — Ambulatory Visit: Payer: BC Managed Care – PPO | Admitting: Skilled Nursing Facility1

## 2020-02-20 ENCOUNTER — Ambulatory Visit: Payer: BC Managed Care – PPO

## 2020-02-20 ENCOUNTER — Encounter: Payer: Self-pay | Admitting: Skilled Nursing Facility1

## 2020-02-20 ENCOUNTER — Other Ambulatory Visit: Payer: BC Managed Care – PPO

## 2020-02-20 DIAGNOSIS — E66813 Obesity, class 3: Secondary | ICD-10-CM

## 2020-02-20 DIAGNOSIS — Z6841 Body Mass Index (BMI) 40.0 and over, adult: Secondary | ICD-10-CM | POA: Diagnosis present

## 2020-02-20 NOTE — Progress Notes (Addendum)
Nutrition Assessment for Bariatric Surgery Medical Nutrition Therapy  Patient was seen on 02/20/2020 for Pre-Operative Nutrition Assessment. Letter of approval faxed to [facility] bariatric surgery program coordinator on 02/22/2020   Referral stated Supervised Weight Loss (SWL) visits needed: 0; pt will return for a minium 1 visit to help affect change, pt understands this is not an insurance requirement   Planned surgery: sleeve gastrectomy Pt expectation of surgery: to feel healthier Pt expectation of dietitian: none identified     NUTRITION ASSESSMENT   Anthropometrics  Start weight at [facility]: 253 lbs (date: 02/20/2020)  Height: 63 in BMI: 44.82 kg/m2     Clinical  Medical hx: post partum depression  Medications: N/A Labs:  Notable signs/symptoms: headaches, hx bells palsy Any previous deficiencies? No  Micronutrient Nutrition Focused Physical Exam: Hair: No issues observed Eyes: No issues observed Mouth: No issues observed Neck: No issues observed Nails: No issues observed Skin: No issues observed  Lifestyle & Dietary Hx  Pt states her mom will make meals for her family. Pt states she orders [employer] online and has her groceries delivered. Pt states she smokes currently taking chantix.   24-Hr Dietary Recall:65+ % of meals eaten out First Meal 9-10am: fast food Snack:  Second Meal 2:30-3:00: fast food  Snack: candy Third Meal 7:30-8pm: taco salad or spagetti and salad Snack: candy Beverages: sweet tea lemonade, soda, wine, water, coffee   Estimated Energy Needs Calories:1500    NUTRITION DIAGNOSIS  Overweight/obesity ([place]-3.3) related to past poor dietary habits and physical inactivity as evidenced by patient w/ planned sleeve surgery following dietary guidelines for continued weight loss.    NUTRITION INTERVENTION  Nutrition counseling (C-1) and education (E-2) to facilitate bariatric surgery goals.   Pre-Op Goals Reviewed with the  Patient . Track food and beverage intake (pen and paper, MyFitness Pal, Baritastic app, etc.) . Make healthy food choices while monitoring portion sizes: limit eating out to once a week . Consume 3 meals per day or try to eat every 3-5 hours . Avoid concentrated sugars and fried foods . Keep sugar & fat in the single digits per serving on food labels . Practice CHEWING your food (aim for applesauce consistency) . Practice not drinking 15 minutes before, during, and 30 minutes after each meal and snack . Avoid all carbonated beverages (ex: soda, sparkling beverages)  . Limit caffeinated beverages (ex: coffee, tea, energy drinks) . Avoid all sugar-sweetened beverages (ex: regular soda, sports drinks)  . Avoid alcohol  . Aim for 64-100 ounces of FLUID daily (with at least half of fluid intake being plain water)  . Aim for at least 60-80 grams of PROTEIN daily . Look for a liquid protein source that contains ?15 g protein and ?5 g carbohydrate (ex: shakes, drinks, shots) . Make a list of non-food related activities . Physical activity is an important part of a healthy lifestyle so keep it moving! The goal is to reach 150 minutes of exercise per week, including cardiovascular and weight baring activity.  *Goals that are bolded indicate the pt would like to start working towards these  Handouts Provided Include  . Bariatric Surgery handouts (Nutrition Visits, Pre-Op Goals, Protein Shakes, Vitamins & Minerals)  Learning Style & Readiness for Change Teaching method utilized: Visual & Auditory  Demonstrated degree of understanding via: Teach Back  Readiness Level: pre-contemplative  Barriers to learning/adherence to lifestyle change: unknown at this time  RD's Notes for Next Visit . Assess pts adherence to chosen goals  MONITORING & EVALUATION Dietary intake, weekly physical activity, body weight, and pre-op goals reached at next nutrition visit.    Next Steps  Patient is to follow up  at [facility] for Pre-Op Class >2 weeks before surgery for further nutrition education.

## 2020-03-06 ENCOUNTER — Ambulatory Visit
Admission: RE | Admit: 2020-03-06 | Discharge: 2020-03-06 | Disposition: A | Discharge status: Home / Self Care | Payer: BC Managed Care – PPO | Source: Ambulatory Visit | Attending: Surgery | Admitting: Surgery

## 2020-03-06 ENCOUNTER — Ambulatory Visit (INDEPENDENT_AMBULATORY_CARE_PROVIDER_SITE_OTHER): Payer: BC Managed Care – PPO | Admitting: Psychology

## 2020-03-06 ENCOUNTER — Other Ambulatory Visit: Payer: Self-pay

## 2020-03-06 ENCOUNTER — Ambulatory Visit: Payer: BC Managed Care – PPO | Admitting: Family Medicine

## 2020-03-16 ENCOUNTER — Encounter: Payer: Self-pay | Admitting: Family Medicine

## 2020-03-16 ENCOUNTER — Other Ambulatory Visit: Payer: Self-pay

## 2020-03-16 ENCOUNTER — Ambulatory Visit (INDEPENDENT_AMBULATORY_CARE_PROVIDER_SITE_OTHER): Payer: Self-pay | Admitting: Family Medicine

## 2020-03-16 VITALS — BP 126/80 | HR 100 | Temp 98.1°F | Resp 16 | Ht 62.0 in | Wt 259.0 lb

## 2020-03-16 DIAGNOSIS — N898 Other specified noninflammatory disorders of vagina: Secondary | ICD-10-CM

## 2020-03-16 DIAGNOSIS — Z720 Tobacco use: Secondary | ICD-10-CM

## 2020-03-16 MED ORDER — NICOTINE 14 MG/24HR TD PT24: 14.0000 mg | MEDICATED_PATCH | Freq: Every day | TRANSDERMAL | 0 refills | Status: DC

## 2020-03-16 MED ORDER — DOXYCYCLINE HYCLATE 100 MG PO TABS: 100.0000 mg | ORAL_TABLET | Freq: Two times a day (BID) | ORAL | 0 refills | Status: DC

## 2020-03-16 MED ORDER — NICOTINE 7 MG/24HR TD PT24: 7.0000 mg | MEDICATED_PATCH | Freq: Every day | TRANSDERMAL | 0 refills | Status: AC

## 2020-03-16 NOTE — Patient Instructions (Addendum)
Start Nicoderm patch

## 2020-03-16 NOTE — Progress Notes (Signed)
   Subjective:    Patient ID: Ashley Stanton, female    DOB: December 31, 1985, 35 y.o.   MRN: [id number]  Patient presents for Vulvular Abscess (X1 month- possible Bartholinian Cyst- no drainage, tender to touch- using sitz baths and has used ABTx )  She feels like she has a cyst on left labia near the opening, for the past 3.5 weeks Initially was really sore but it drained some and now feels better but she is unable to see it. She took oral antibioitics - Clindamy for 6 days as well  Needs help with smoking, she is planning on having gastric sleeve   Review Of Systems:  GEN- denies fatigue, fever, weight loss,weakness, recent illness HEENT- denies eye drainage, change in vision, nasal discharge, CVS- denies chest pain, palpitations RESP- denies SOB, cough, wheeze ABD- denies N/V, change in stools, abd pain GU- denies dysuria, hematuria, dribbling, incontinence MSK- denies joint pain, muscle aches, injury Neuro- denies headache, dizziness, syncope, seizure activity       Objective:    BP 126/80   Pulse 100   Temp 98.1 F (36.7 C) (Temporal)   Resp 16   Ht 5\' 2"  (1.575 m)   Wt 259 lb (117.5 kg)   SpO2 97%   BMI 47.37 kg/m  GEN- NAD, alert and oriented x3 CVS- RRR, no murmur RESP-CTAB ABD-NABS,soft,NT,ND GU- normal external genitalia, vaginal mucosa pink and moist, cervix visualized no growth, left labia very small area of swelling near hair follicule, no blood form os, minimal  discharge EXT- No edema Pulses- Radial 2+        Assessment & Plan:      Problem List Items Addressed This Visit      Unprioritized   Tobacco use - Primary    chantix on back order Given nicoderm patches 14mg  > 7mg  She smokes at most 10 cig a day       Other Visit Diagnoses    Vaginal cyst       improved ? if it was superinfected as she did take 6 days of antibiotics, continue epson salt soak, given doxycycline in case it reoccurs      Note: This dictation was prepared with Dragon  dictation along with smaller phrase technology. Any transcriptional errors that result from this process are unintentional.

## 2020-03-16 NOTE — Assessment & Plan Note (Signed)
chantix on back order Given nicoderm patches 14mg  > 7mg  She smokes at most 10 cig a day

## 2020-04-03 ENCOUNTER — Ambulatory Visit (INDEPENDENT_AMBULATORY_CARE_PROVIDER_SITE_OTHER): Payer: BC Managed Care – PPO | Admitting: Psychology

## 2020-04-25 ENCOUNTER — Telehealth: Payer: Self-pay

## 2020-04-25 NOTE — Telephone Encounter (Signed)
Noted. Will order to arrive by apt date/time. 

## 2020-04-25 NOTE — Telephone Encounter (Signed)
Patient is scheduled for 05/25/20 at 1:30 for annual and nexplanon replacement

## 2020-05-25 ENCOUNTER — Encounter: Payer: Self-pay | Admitting: Obstetrics and Gynecology

## 2020-05-25 ENCOUNTER — Other Ambulatory Visit: Payer: Self-pay

## 2020-05-25 ENCOUNTER — Ambulatory Visit (INDEPENDENT_AMBULATORY_CARE_PROVIDER_SITE_OTHER): Payer: BC Managed Care – PPO | Admitting: Obstetrics and Gynecology

## 2020-05-25 VITALS — BP 126/74 | Ht 63.0 in | Wt 263.0 lb

## 2020-05-25 DIAGNOSIS — Z1331 Encounter for screening for depression: Secondary | ICD-10-CM

## 2020-05-25 DIAGNOSIS — Z3046 Encounter for surveillance of implantable subdermal contraceptive: Secondary | ICD-10-CM

## 2020-05-25 DIAGNOSIS — Z1339 Encounter for screening examination for other mental health and behavioral disorders: Secondary | ICD-10-CM | POA: Diagnosis not present

## 2020-05-25 DIAGNOSIS — Z01419 Encounter for gynecological examination (general) (routine) without abnormal findings: Secondary | ICD-10-CM | POA: Diagnosis not present

## 2020-05-25 NOTE — Progress Notes (Signed)
Gynecology Annual Exam  PCP: Salley Scarlet, MD  Chief Complaint  Patient presents with  . Annual Exam   History of Present Illness:  Ms. Ashley Stanton is a 35 y.o. [id number] who LMP was No LMP recorded. Patient has had an implant., presents today for her annual examination.  Her menses are infrequent with Nexplanon. Nexplanon placed 02/2017. Her last period was in July 2021.   She is sexually active. Uses Nexplanon.  Last Pap: 11/16/2019  Results were: no abnormalities /neg HPV DNA not done (last done in 2018 and was negative) Hx of STDs: trichomonas, 2017  There is no FH of breast cancer. There is no FH of ovarian cancer. The patient does not self-breast exams.  Tobacco use: uses 1/2 ppd. She has tried Chantix in the past. She is using patches to help her quit.  She  Alcohol use: social drinker Exercise: she is no longer working out.  She trying to get a gastric sleeve. She is down the pathway. She has to be smoke-free for 6 weeks to get a surgery date.   The patient wears seatbelts: no.   The patient reports that domestic violence in her life is absent.   Past Medical History:  Diagnosis Date  . Allergy    SEASONAL  . Bell's palsy 05/08/2014  . Bell's palsy 2016  . Bell's palsy   . Hidradenitis axillaris     Past Surgical History:  Procedure Laterality Date  . CESAREAN SECTION  02/11/2009  . CESAREAN SECTION N/A 06/17/2015   Procedure: REPEAT CESAREAN SECTION;  Surgeon: Conard Novak, MD;  Location: [facility] ORS;  Service: Obstetrics;  Laterality: N/A;  . HYDRADENITIS EXCISION Left 08/10/2018   Procedure: EXCISION OF LEFT AXILLARY HIDRADENITIS;  Surgeon: Berna Bue, MD;  Location: [facility] OR;  Service: General;  Laterality: Left;    Prior to Admission medications   Medication Sig Start Date End Date Taking? Authorizing Provider  etonogestrel (NEXPLANON) 68 MG IMPL implant 1 each by Subdermal route once.   Yes [provider]  cetirizine (ZYRTEC) 10 MG tablet Take  20 mg by mouth 2 (two) times daily as needed (allergic reaction).    [provider]  valACYclovir (VALTREX) 500 MG tablet Take 500 mg by mouth daily as needed (fever blisters).    [provider]   Allergies  Allergen Reactions  . Alpha-Gal Hives and Itching  . Pineapple Swelling    Lip swelling    Obstetric History: [id number]  Social History   Socioeconomic History  . Marital status: Single    Spouse name: Not on file  . Number of children: Not on file  . Years of education: Not on file  . Highest education level: Not on file  Occupational History  . Not on file  Tobacco Use  . Smoking status: Current Every Day Smoker    Packs/day: 0.50    Types: Cigarettes  . Smokeless tobacco: Never Used  Vaping Use  . Vaping Use: Never used  Substance and Sexual Activity  . Alcohol use: Yes    Alcohol/week: 2.0 standard drinks    Types: 2 Glasses of wine per week    Comment: every 2 to 3 weeks  . Drug use: No  . Sexual activity: Yes    Birth control/protection: Condom, Implant  Other Topics Concern  . Not on file  Social History Narrative  . Not on file   Social Determinants of Health   Financial Resource Strain: Not on file  Food Insecurity: Not on file  Transportation Needs: Not on file  Physical Activity: Not on file  Stress: Not on file  Social Connections: Not on file  Intimate Partner Violence: Not on file    Family History  Problem Relation Age of Onset  . Miscarriages / [place] Mother   . Alcohol abuse Father   . Asthma Father   . Diabetes Father   . Hypertension Maternal Grandmother   . Stroke Maternal Grandmother   . Heart disease Maternal Grandfather     Review of Systems  Constitutional: Negative.   HENT: Negative.   Eyes: Negative.   Respiratory: Negative.   Cardiovascular: Negative.   Gastrointestinal: Negative.   Genitourinary: Negative.   Musculoskeletal: Negative.   Skin: Negative.   Neurological: Negative.    Psychiatric/Behavioral: Negative.      Physical Exam BP 126/74   Ht 5\' 3"  (1.6 m)   Wt 263 lb (119.3 kg)   BMI 46.59 kg/m    Physical Exam Constitutional:      General: She is not in acute distress.    Appearance: Normal appearance. She is well-developed.  Genitourinary:     Genitourinary Comments: Breast/Pelvic Deferred as done in 10/21 by PCP  HENT:     Head: Normocephalic and atraumatic.  Eyes:     General: No scleral icterus.    Conjunctiva/sclera: Conjunctivae normal.  Neck:     Thyroid: No thyromegaly.  Cardiovascular:     Rate and Rhythm: Normal rate and regular rhythm.     Heart sounds: No murmur heard. No friction rub. No gallop.   Pulmonary:     Effort: Pulmonary effort is normal. No respiratory distress.     Breath sounds: Normal breath sounds. No wheezing or rales.  Abdominal:     General: Bowel sounds are normal. There is no distension.     Palpations: Abdomen is soft. There is no mass.     Tenderness: There is no abdominal tenderness. There is no guarding or rebound.  Musculoskeletal:        General: No swelling or tenderness. Normal range of motion.     Cervical back: Normal range of motion and neck supple.  Lymphadenopathy:     Cervical: No cervical adenopathy.     Lower Body: No right inguinal adenopathy. No left inguinal adenopathy.  Neurological:     General: No focal deficit present.     Mental Status: She is alert and oriented to person, place, and time.     Cranial Nerves: No cranial nerve deficit.  Skin:    General: Skin is warm and dry.     Findings: No erythema or rash.  Psychiatric:        Mood and Affect: Mood normal.        Behavior: Behavior normal.        Judgment: Judgment normal.    Nexplanon removal/placement in new site.   Female chaperone present for pelvic and breast  portions of the physical exam  Results: AUDIT Questionnaire (screen for alcoholism): 4 PHQ-9: 1   Assessment: 35 y.o. G56P2002 female here for routine  annual gynecologic examination female here for routine  annual gynecologic examination  Plan: Problem List Items Addressed This Visit   None   Visit Diagnoses    Women's annual routine gynecological examination    -  Primary   Screening for depression       Screening for alcoholism       Encounter for removal and reinsertion of Nexplanon       Relevant Medications  etonogestrel (NEXPLANON) 68 MG IMPL implant      Screening: -- Blood pressure screen normal -- Weight screening: obese: discussed management options, including lifestyle, dietary, and exercise. -- Depression screening negative (PHQ-9) -- Nutrition: normal -- cholesterol screening: not due for screening -- osteoporosis screening: not due -- tobacco screening: using: discussed quitting using the 5 A's. She is currently taking Chantix.  -- alcohol screening: AUDIT questionnaire indicates low-risk usage. -- family history of breast cancer screening: done. not at high risk. -- no evidence of domestic violence or intimate partner violence. -- STD screening: gonorrhea/chlamydia NAAT collected -- pap smear not collected per ASCCP guidelines -- flu vaccine received   Nexplanon removed and replaced.    Offered smoking cessation support. Discussed the use of Wellbutrin.  She will consider.   Thomasene Mohair, MD 05/25/2020 1:41 PM

## 2020-05-28 ENCOUNTER — Other Ambulatory Visit: Payer: Self-pay

## 2020-05-28 ENCOUNTER — Encounter: Payer: BC Managed Care – PPO | Attending: Surgery | Admitting: Skilled Nursing Facility1

## 2020-05-28 DIAGNOSIS — Z6841 Body Mass Index (BMI) 40.0 and over, adult: Secondary | ICD-10-CM | POA: Insufficient documentation

## 2020-05-28 NOTE — Progress Notes (Signed)
Supervised Weight Loss Visit Bariatric Nutrition Education Appt Start Time: 10:45am End Time: 11:20am  Planned Surgery: sleeve  Pt completed visits.   Pt has cleared nutrition requirements.   Anthropometrics  Start weight at [facility]: 253 lbs self-report from virtual appt (date: 02/20/2020) Today's weight: 263.1 lbs BMI: 46.61 kg/m2    Clinical  Medical Hx: post partum depression  Medications: see list Labs: most recent lipid panel reviewed. Notable Signs/Symptoms: headaches, hx bells palsy  Lifestyle & Dietary Hx Pt reports making several diet changes: -preparing more food at home rather than eating fast food -drinking premier oats and protein shake for breakfast rather than eating fast food biscuit -cutting out all soda since two weeks ago and drinking more water  Pt reports she has taken leave from her job and has more time to prep food and eat dinner earlier.  Her plan for when she returns to work is to 1) have her daughter help her meal prep (defrost meat), use crock pot or air fryer 3) not make special meals for her children 4) pack lunch for work  Pt reports trying to quit smoking.  She reports using patches but they don't stay on for full 24 hours.  While at this visit today, she enrolled in [facility] smoking cessation program that starts May 2nd in [place].    Pt currently smoking.    24-Hr Dietary Recall: First Meal:  Protein shake Snack: none Second Meal:  Lunch prepared at home Snack: none Third Meal chicken, rice, broccoli Snack: Beverages: water   Estimated Energy Needs Calories:1500    NUTRITION DIAGNOSIS  Overweight/obesity ([place]-3.3) related to past poor dietary habits and physical inactivity as evidenced by patient w/ planned sleeve surgery following dietary guidelines for continued weight loss.    NUTRITION INTERVENTION  Nutrition counseling (C-1) and education (E-2) to facilitate bariatric surgery goals.  Goals Previously Chosen: . Continue - Track  food and beverage intake (pen and paper, MyFitness Pal, Baritastic app, etc.) . Continue - Make healthy food choices while monitoring portion sizes: limit eating out to once a week    Handouts Provided Include  None  Learning Style & Readiness for Change Teaching method utilized: Visual & Auditory  Demonstrated degree of understanding via: Teach Back  Readiness Level: action Barriers to learning/adherence to lifestyle change: none identified at this time  RD's Notes for Next Visit . Assess pts adherence to chosen goals     MONITORING & EVALUATION Dietary intake, weekly physical activity, body weight, and pre-op goals reached at next nutrition visit.    Next Steps  Patient is to follow up at [facility] for Pre-Op Class >2 weeks before surgery for further nutrition education.  Pt has completed visits. No further supervised visits required/recomended

## 2020-05-29 MED ORDER — ETONOGESTREL 68 MG ~~LOC~~ IMPL: 1.0000 | DRUG_IMPLANT | Freq: Once | SUBCUTANEOUS | 0 refills | Status: AC

## 2020-08-28 ENCOUNTER — Telehealth: Payer: Self-pay | Admitting: *Deleted

## 2020-08-28 NOTE — Telephone Encounter (Signed)
Call placed to patient and patient made aware.   Appointment scheduled for medication refills.

## 2020-08-28 NOTE — Telephone Encounter (Signed)
Received call from patient.   Requested to transfer care to new provider.   Are you able to accept?

## 2020-08-31 ENCOUNTER — Ambulatory Visit: Payer: Self-pay | Admitting: Nurse Practitioner

## 2020-08-31 NOTE — Progress Notes (Deleted)
Subjective:    Patient ID: Ashley Stanton, female    DOB: 1985/03/29, 35 y.o.   MRN: [id number]  HPI: Ashley Stanton is a 35 y.o. female presenting for  No chief complaint on file.  INSULIN RESISTANCE Hypoglycemic episodes:{Blank single:19197::"yes","no"} Polydipsia/polyuria: {Blank single:19197::"yes","no"} Visual disturbance: {Blank single:19197::"yes","no"} Chest pain: {Blank single:19197::"yes","no"} Paresthesias: {Blank single:19197::"yes","no"} Glucose Monitoring: {Blank single:19197::"yes","no"}  Accucheck frequency:  Fasting glucose:  Post prandial:  Evening:  Before meals: Taking Insulin?: {Blank single:19197::"yes","no"}  Berninger acting insulin:  Short acting insulin:   Allergies  Allergen Reactions   Alpha-Gal Hives and Itching   Pineapple Swelling    Lip swelling     Outpatient Encounter Medications as of 08/31/2020  Medication Sig   cetirizine (ZYRTEC) 10 MG tablet Take 20 mg by mouth 2 (two) times daily as needed (allergic reaction). (Patient not taking: Reported on 05/25/2020)   doxycycline (VIBRA-TABS) 100 MG tablet Take 1 tablet (100 mg total) by mouth 2 (two) times daily. (Patient not taking: Reported on 05/25/2020)   etonogestrel (NEXPLANON) 68 MG IMPL implant 1 each (68 mg total) by Subdermal route once for 1 dose.   ibuprofen (ADVIL) 200 MG tablet Take 600 mg by mouth every 6 (six) hours as needed for moderate pain. (Patient not taking: Reported on 05/25/2020)   metFORMIN (GLUCOPHAGE) 500 MG tablet Take 1 tablet (500 mg total) by mouth daily with breakfast.   nicotine (NICODERM CQ) 14 mg/24hr patch Place 1 patch (14 mg total) onto the skin daily. (Patient not taking: Reported on 05/25/2020)   valACYclovir (VALTREX) 500 MG tablet Take 500 mg by mouth daily as needed (fever blisters). (Patient not taking: Reported on 05/25/2020)   No facility-administered encounter medications on file as of 08/31/2020.    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 Interim medical history since our last visit reviewed.  Review of Systems Per HPI unless specifically indicated above     Objective:    There were no vitals taken for this visit.  Wt Readings from Last 3 Encounters:  05/28/20 263 lb 1.6 oz (119.3 kg)  05/25/20 263 lb (119.3 kg)  03/16/20 259 lb (117.5 kg)    Physical Exam  Results for orders placed or performed in visit on 01/04/20  Urine Culture   Specimen: Urine  Result Value Ref Range   MICRO NUMBER: [id number]    SPECIMEN QUALITY: Adequate    Sample Source URINE    STATUS: FINAL    ISOLATE 1:      Growth of mixed flora was isolated, suggesting probable contamination. No further testing will be performed. If clinically indicated, recollection using a method to minimize contamination, with prompt transfer to Urine Culture Transport Tube, is  recommended.   WET PREP FOR TRICH, YEAST, CLUE   Specimen: Genital  Result Value Ref Range   Source: GENITAL    RESULT    Urinalysis, Routine w reflex microscopic  Result Value Ref Range   Color, Urine YELLOW YELLOW   APPearance CLEAR CLEAR   Specific Gravity, Urine 1.026 1.001 - 1.03   pH 5.5 5.0 - 8.0   Glucose, UA NEGATIVE NEGATIVE   Bilirubin Urine NEGATIVE NEGATIVE   Ketones, ur NEGATIVE NEGATIVE   Hgb urine dipstick 2+ (A) NEGATIVE   Protein, ur NEGATIVE NEGATIVE   Nitrite NEGATIVE NEGATIVE   Leukocytes,Ua NEGATIVE NEGATIVE   WBC, UA NONE SEEN 0 - 5 /HPF    RBC / HPF 3-10 (A) 0 - 2 /HPF   Squamous Epithelial / LPF 6-10 (A) < OR = 5 /HPF   Bacteria, UA FEW (A) NONE SEEN /HPF   Hyaline Cast NONE SEEN NONE SEEN /LPF   Urine-Other    Microscopic Message  Result Value Ref Range   Note        Assessment & Plan:   Problem List Items Addressed This Visit   None    Follow up plan: No follow-ups on file.

## 2020-09-10 NOTE — Progress Notes (Signed)
Subjective:    Patient ID: Ashley Stanton, female    DOB: 1985-05-07, 35 y.o.   MRN: [id number]  HPI: Ashley Stanton is a 35 y.o. female presenting for medication refill.  Chief Complaint  Patient presents with   Medication Refill    Refill on doxy due to the boils that come under arm, takes prn, and valtrex  Does not take metformin daily due to diarrhea.   Wants labs, pt is fasting. Took fasting glucose at wellness visit at work, was told to follow up with pcp   INSULIN RESISTANCE Has fasting glucose at work - 114.  Works at [employer] in [place].  Was on Metformin - stopped taking due to side effects including diarrhea. 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  Weekends are bad.  History of herpes - Uses valtrex when she has an outbreak.  Usually twice per year.  Stress triggers.  Usually on back of bottom - burns and itches.   History of hidradenitis suppurativa.  Gets boils under her arms only.  Has had surgical procedure under left arm to remove cyst sacs and has not had a severe recurrent since then.  She does like to keep doxycycline on hand in case boils pop up and start using.  Previously used clindamycin .  ELEVATED BMI: 46.9 She is trying to stop smoking so she can have Bariatric surgery.  Duration: chronic Previous attempts at weight loss: yes Complications of obesity: Insulin resistance, Peak weight: 265 lbs Weight loss goal: TBD Requesting obesity pharmacotherapy: no Current weight loss supplements/medications: no Does not stay as active as she would like to.  Reports she could work on her diet-eats out a lot on the weekends.  She is very busy with work right  now.  RASH Duration:  months  Location: back of left calf  Itching: yes Burning: no Redness: yes Oozing: no Scaling: no Blisters: no Painful: no Fevers: no Change in detergents/soaps/personal care products: no Recent illness: no Recent travel:no History of same: no Context: waxing and waning Alleviating factors: nothing Treatments attempted: OTC cream Shortness of breath: no  Throat/tongue swelling: no Myalgias/arthralgias: no  Allergies  Allergen Reactions   Alpha-Gal Hives and Itching   Pineapple Swelling    Lip swelling     Outpatient Encounter Medications as of 09/12/2020  Medication Sig Note   cetirizine (ZYRTEC) 10 MG tablet Take 20 mg by mouth 2 (two) times daily as needed (allergic reaction).    clotrimazole-betamethasone (LOTRISONE) lotion Apply topically 2 (two) times daily. If rash no better after ~2 weeks of use, return to clinic    etonogestrel (NEXPLANON) 68 MG IMPL implant 1 each (68 mg total) by Subdermal route once for 1 dose.    ibuprofen (ADVIL) 200 MG tablet Take 600 mg by mouth every 6 (six) hours as needed for moderate pain.    metFORMIN (GLUCOPHAGE) 500 MG tablet Take 1 tablet (500 mg total) by mouth daily with breakfast.    [DISCONTINUED] doxycycline (VIBRA-TABS) 100 MG tablet Take 1 tablet (100 mg total) by mouth 2 (two) times daily. 09/12/2020: PRN   [DISCONTINUED] nicotine (NICODERM CQ) 14 mg/24hr patch Place 1 patch (14 mg total) onto the skin daily.    [DISCONTINUED]  valACYclovir (VALTREX) 500 MG tablet Take 500 mg by mouth daily as needed (fever blisters).    doxycycline (VIBRA-TABS) 100 MG tablet Take 1 tablet (100 mg total) by mouth 2 (two) times daily.    valACYclovir (VALTREX) 500 MG tablet Take 1 tablet (500 mg total) by mouth 2 (two) times daily as needed (herpes outbreak). Take 500 mg twice daily for 3 days at onset of flare.    No facility-administered encounter medications on file as of 09/12/2020.    Patient Active Problem List    Diagnosis Date Noted   Allergies 01/11/2019   Seasonal allergies 01/11/2019   Tobacco use 07/13/2018   Borderline diabetes 07/13/2018   HSV (herpes simplex virus) infection 04/01/2017   Insomnia 03/12/2016   Tinea pedis 03/12/2016   Obesity 10/13/2015   Hidradenitis suppurativa 07/14/2014    Past Medical History:  Diagnosis Date   Allergy    SEASONAL   Bell's palsy 05/08/2014   Bell's palsy 2016   Bell's palsy    Hidradenitis axillaris     Relevant past medical, surgical, family and social history reviewed and updated as indicated. Interim medical history since our last visit reviewed.  Review of Systems Per HPI unless specifically indicated above     Objective:    BP 126/70   Pulse 97   Temp 98.5 F (36.9 C)   Ht 5\' 3"  (1.6 m)   Wt 265 lb (120.2 kg)   SpO2 97%   Breastfeeding No   BMI 46.94 kg/m   Wt Readings from Last 3 Encounters:  09/12/20 265 lb (120.2 kg)  05/28/20 263 lb 1.6 oz (119.3 kg)  05/25/20 263 lb (119.3 kg)    Physical Exam Vitals and nursing note reviewed.  Constitutional:      General: She is not in acute distress.    Appearance: Normal appearance. She is obese. She is not toxic-appearing.  Neck:     Vascular: No carotid bruit.  Cardiovascular:     Rate and Rhythm: Normal rate.     Heart sounds: Normal heart sounds. No murmur heard. Pulmonary:     Effort: Pulmonary effort is normal. No respiratory distress.     Breath sounds: Normal breath sounds. No wheezing, rhonchi or rales.  Abdominal:     General: Abdomen is flat. Bowel sounds are normal.     Palpations: Abdomen is soft.     Tenderness: There is no abdominal tenderness.  Musculoskeletal:     Cervical back: Normal range of motion.  Skin:    General: Skin is warm and dry.     Capillary Refill: Capillary refill takes less than 2 seconds.     Coloration: Skin is not jaundiced.     Findings: Rash present. No bruising.          Comments: Annular, hyperpigmented rash noted to left  calf approximately 3 cm x 2 cm in size.  No drainage, satellite lesions, flaking, scaling.   Neurological:     Mental Status: She is alert and oriented to person, place, and time.     Motor: No weakness.     Gait: Gait normal.  Psychiatric:        Mood and Affect: Mood normal.        Behavior: Behavior normal.        Thought Content: Thought content normal.        Judgment: Judgment normal.      Assessment & Plan:   Problem List Items Addressed This Visit  Musculoskeletal and Integument   Hidradenitis suppurativa    Chronic.  Continue to treat and frequent boils with doxycycline twice daily for less than 7 days until boil resolves.  We will give refill of doxycycline to have on hand.  Continue supportive care including warm compresses at onset of boil development.  Follow-up if boils more frequent or worsening.      Relevant Medications   valACYclovir (VALTREX) 500 MG tablet   doxycycline (VIBRA-TABS) 100 MG tablet   clotrimazole-betamethasone (LOTRISONE) lotion     Other   Tobacco use    Discussed cessation including cutting back to quit method -cut back by 1 cigarette/week until completely stopped..  Has nicotine patches at home.  She is motivated to quit and counseling was given today.      Obesity - Primary    Chronic.  Discussed dietary and lifestyle changes including meal prepping-focus on fruits and veggies with lean protein.  Goal for physical activity is 30 minutes 5 times weekly.  Goal water intake is 64 ounces daily.  We will check labs today including hemoglobin A1c, cholesterol levels, electrolytes with kidney function.  Follow-up pending blood work.      Relevant Orders   Hemoglobin A1c (Completed)   Lipid panel (Completed)   COMPLETE METABOLIC PANEL WITH GFR (Completed)   CBC with Differential/Platelet (Completed)   Insulin, random   HSV (herpes simplex virus) infection    Refill for Valtrex given to use at onset of flare of herpes.  She is to take 500  mg twice daily for 3 days at onset of flare.  Return to clinic if symptoms persist despite treatment.      Relevant Medications   valACYclovir (VALTREX) 500 MG tablet   clotrimazole-betamethasone (LOTRISONE) lotion   Other Visit Diagnoses     Rash       Annular appearance-appears fungal.  Will treat with combination lotion.  Patient to return to clinic if the cream does not help.   Relevant Medications   clotrimazole-betamethasone (LOTRISONE) lotion   Encounter for smoking cessation counseling       Screening for thyroid disorder       Relevant Orders   TSH (Completed)        Follow up plan: Return for pending lab work.

## 2020-09-12 ENCOUNTER — Other Ambulatory Visit: Payer: Self-pay

## 2020-09-12 ENCOUNTER — Ambulatory Visit (INDEPENDENT_AMBULATORY_CARE_PROVIDER_SITE_OTHER): Payer: Managed Care, Other (non HMO) | Admitting: Nurse Practitioner

## 2020-09-12 ENCOUNTER — Encounter: Payer: Self-pay | Admitting: Nurse Practitioner

## 2020-09-12 VITALS — BP 126/70 | HR 97 | Temp 98.5°F | Ht 63.0 in | Wt 265.0 lb

## 2020-09-12 DIAGNOSIS — Z6841 Body Mass Index (BMI) 40.0 and over, adult: Secondary | ICD-10-CM

## 2020-09-12 DIAGNOSIS — Z1329 Encounter for screening for other suspected endocrine disorder: Secondary | ICD-10-CM

## 2020-09-12 DIAGNOSIS — L732 Hidradenitis suppurativa: Secondary | ICD-10-CM

## 2020-09-12 DIAGNOSIS — B009 Herpesviral infection, unspecified: Secondary | ICD-10-CM

## 2020-09-12 DIAGNOSIS — Z720 Tobacco use: Secondary | ICD-10-CM | POA: Diagnosis not present

## 2020-09-12 DIAGNOSIS — Z716 Tobacco abuse counseling: Secondary | ICD-10-CM

## 2020-09-12 DIAGNOSIS — R21 Rash and other nonspecific skin eruption: Secondary | ICD-10-CM

## 2020-09-12 MED ORDER — CLOTRIMAZOLE-BETAMETHASONE 1-0.05 % EX LOTN: TOPICAL_LOTION | Freq: Two times a day (BID) | CUTANEOUS | 0 refills | Status: DC

## 2020-09-12 MED ORDER — DOXYCYCLINE HYCLATE 100 MG PO TABS: 100.0000 mg | ORAL_TABLET | Freq: Two times a day (BID) | ORAL | 0 refills | Status: DC

## 2020-09-12 MED ORDER — VALACYCLOVIR HCL 500 MG PO TABS: 500.0000 mg | ORAL_TABLET | Freq: Two times a day (BID) | ORAL | 0 refills | Status: DC | PRN

## 2020-09-13 LAB — LIPID PANEL
Cholesterol: 145 mg/dL (ref <200)
HDL: 46 mg/dL — ABNORMAL LOW (ref >=50)
LDL Cholesterol (Calc): 80 mg/dL (calc)
Non-HDL Cholesterol (Calc): 99 mg/dL (calc) (ref <130)
Total CHOL/HDL Ratio: 3.2 (calc) (ref <5.0)
Triglycerides: 109 mg/dL (ref <150)

## 2020-09-13 LAB — COMPLETE METABOLIC PANEL WITH GFR
AG Ratio: 1.3 (calc) (ref 1.0–2.5)
ALT: 15 U/L (ref 6–29)
AST: 15 U/L (ref 10–30)
Albumin: 4 g/dL (ref 3.6–5.1)
Alkaline phosphatase (APISO): 75 U/L (ref 31–125)
BUN: 7 mg/dL (ref 7–25)
CO2: 22 mmol/L (ref 20–32)
Calcium: 9.2 mg/dL (ref 8.6–10.2)
Chloride: 105 mmol/L (ref 98–110)
Creat: 0.97 mg/dL (ref 0.50–0.97)
Globulin: 3 g/dL (calc) (ref 1.9–3.7)
Glucose, Bld: 120 mg/dL — ABNORMAL HIGH (ref 65–99)
Potassium: 4.4 mmol/L (ref 3.5–5.3)
Sodium: 139 mmol/L (ref 135–146)
Total Bilirubin: 0.4 mg/dL (ref 0.2–1.2)
Total Protein: 7 g/dL (ref 6.1–8.1)
eGFR: 78 mL/min/{1.73_m2} (ref >=60)

## 2020-09-13 LAB — CBC WITH DIFFERENTIAL/PLATELET
Absolute Monocytes: 391 cells/uL (ref 200–950)
Basophils Absolute: 21 cells/uL (ref 0–200)
Basophils Relative: 0.3 %
Eosinophils Absolute: 92 cells/uL (ref 15–500)
Eosinophils Relative: 1.3 %
HCT: 41.3 % (ref 35.0–45.0)
Hemoglobin: 13.3 g/dL (ref 11.7–15.5)
Lymphs Abs: 2670 cells/uL (ref 850–3900)
MCH: 28.3 pg (ref 27.0–33.0)
MCHC: 32.2 g/dL (ref 32.0–36.0)
MCV: 87.9 fL (ref 80.0–100.0)
MPV: 9.8 fL (ref 7.5–12.5)
Monocytes Relative: 5.5 %
Neutro Abs: 3926 cells/uL (ref 1500–7800)
Neutrophils Relative %: 55.3 %
Platelets: 323 10*3/uL (ref 140–400)
RBC: 4.7 10*6/uL (ref 3.80–5.10)
RDW: 12.8 % (ref 11.0–15.0)
Total Lymphocyte: 37.6 %
WBC: 7.1 10*3/uL (ref 3.8–10.8)

## 2020-09-13 LAB — HEMOGLOBIN A1C
Hgb A1c MFr Bld: 6 % of total Hgb — ABNORMAL HIGH (ref <5.7)
Mean Plasma Glucose: 126 mg/dL
eAG (mmol/L): 7 mmol/L

## 2020-09-13 LAB — TSH: TSH: 2.37 mIU/L

## 2020-09-13 LAB — INSULIN, RANDOM: Insulin: 27.9 u[IU]/mL — ABNORMAL HIGH

## 2020-09-13 NOTE — Assessment & Plan Note (Signed)
Discussed cessation including cutting back to quit method -cut back by 1 cigarette/week until completely stopped..  Has nicotine patches at home.  She is motivated to quit and counseling was given today.

## 2020-09-13 NOTE — Assessment & Plan Note (Signed)
Chronic.  Continue to treat and frequent boils with doxycycline twice daily for less than 7 days until boil resolves.  We will give refill of doxycycline to have on hand.  Continue supportive care including warm compresses at onset of boil development.  Follow-up if boils more frequent or worsening.

## 2020-09-13 NOTE — Assessment & Plan Note (Signed)
Refill for Valtrex given to use at onset of flare of herpes.  She is to take 500 mg twice daily for 3 days at onset of flare.  Return to clinic if symptoms persist despite treatment.

## 2020-09-13 NOTE — Assessment & Plan Note (Signed)
Chronic.  Discussed dietary and lifestyle changes including meal prepping-focus on fruits and veggies with lean protein.  Goal for physical activity is 30 minutes 5 times weekly.  Goal water intake is 64 ounces daily.  We will check labs today including hemoglobin A1c, cholesterol levels, electrolytes with kidney function.  Follow-up pending blood work.

## 2020-11-26 NOTE — Telephone Encounter (Signed)
Nexplanon rcvd/charge 05/25/20

## 2021-01-08 NOTE — Telephone Encounter (Signed)
Nexplanon not rcvd

## 2021-03-25 ENCOUNTER — Encounter: Payer: Self-pay | Admitting: Nurse Practitioner

## 2021-03-25 ENCOUNTER — Other Ambulatory Visit: Payer: Self-pay

## 2021-03-25 ENCOUNTER — Ambulatory Visit (INDEPENDENT_AMBULATORY_CARE_PROVIDER_SITE_OTHER): Payer: Self-pay | Admitting: Nurse Practitioner

## 2021-03-25 VITALS — BP 128/77 | HR 103 | Temp 98.0°F | Ht 63.0 in | Wt 271.0 lb

## 2021-03-25 DIAGNOSIS — L732 Hidradenitis suppurativa: Secondary | ICD-10-CM

## 2021-03-25 DIAGNOSIS — Z1329 Encounter for screening for other suspected endocrine disorder: Secondary | ICD-10-CM

## 2021-03-25 DIAGNOSIS — Z7689 Persons encountering health services in other specified circumstances: Secondary | ICD-10-CM

## 2021-03-25 DIAGNOSIS — R7303 Prediabetes: Secondary | ICD-10-CM

## 2021-03-25 DIAGNOSIS — Z720 Tobacco use: Secondary | ICD-10-CM

## 2021-03-25 DIAGNOSIS — M25561 Pain in right knee: Secondary | ICD-10-CM

## 2021-03-25 DIAGNOSIS — Z6841 Body Mass Index (BMI) 40.0 and over, adult: Secondary | ICD-10-CM

## 2021-03-25 DIAGNOSIS — Z13220 Encounter for screening for lipoid disorders: Secondary | ICD-10-CM

## 2021-03-25 LAB — POCT URINALYSIS DIP (CLINITEK)
Bilirubin, UA: NEGATIVE
Glucose, UA: NEGATIVE mg/dL
Ketones, POC UA: NEGATIVE mg/dL
Leukocytes, UA: NEGATIVE
Nitrite, UA: NEGATIVE
POC PROTEIN,UA: NEGATIVE
Spec Grav, UA: 1.01 (ref 1.010–1.025)
Urobilinogen, UA: 0.2 E.U./dL
pH, UA: 6 (ref 5.0–8.0)

## 2021-03-25 LAB — POCT GLYCOSYLATED HEMOGLOBIN (HGB A1C)
HbA1c POC (<> result, manual entry): 6.2 % (ref 4.0–5.6)
HbA1c, POC (controlled diabetic range): 6.2 % (ref 0.0–7.0)
HbA1c, POC (prediabetic range): 6.2 % (ref 5.7–6.4)
Hemoglobin A1C: 6.2 % — AB (ref 4.0–5.6)

## 2021-03-25 LAB — GLUCOSE, POCT (MANUAL RESULT ENTRY): POC Glucose: 172 mg/dl — AB (ref 70–99)

## 2021-03-25 MED ORDER — VARENICLINE TARTRATE (STARTER) 0.5 MG X 11 & 1 MG X 42 PO TBPK: 1.0000 | ORAL_TABLET | ORAL | 0 refills | Status: DC

## 2021-03-25 NOTE — Progress Notes (Signed)
[facility] Patient [facility] [address] [place], [place]  [address] Phone:  [phone]   Fax:  [phone]   New Patient Office Visit  Subjective:  Patient ID: Ashley Stanton, female    DOB: 24-Jan-1986  Age: 36 y.o. MRN: [id number]  CC:  Chief Complaint  Patient presents with   Establish Care    Pt is here today to establish care and would like to discuss her right knee pain. Pt states that her right knee has been bothering her for the past 2 weeks. Pt also wants information to help her stop smoking.    HPI Ashley Stanton presents to establish care. She  has a past medical history of Allergy, Bell's palsy (05/08/2014), Bell's palsy (2016), Bell's palsy, and Hidradenitis axillaris.   She has prediabetes prescribed metformin 500 mg qd. She has been consistent for the last week. She does not consistently take this. Denies fever, chills, headache, dizziness, visual changes, polydipsia,  polyphagia shortness of breath, dyspnea on exertion, chest pain, nausea, vomiting, polyuria, constipation, diarrhea, or any edema. She has  She has noticed headaches.   Knee Pain Patient presents with knee pain involving the right knee. Onset of the symptoms was several weeks ago. Inciting event: none known,   . Current symptoms include popping sensation. Pain is aggravated by going up and down stairs. Patient has had no prior knee problems. Evaluation to date: none. Treatment to date: none. She tends to use the right leg ; leaning to the leg more. She is concern that it may be shorter. She denies any swelling. She does have some tingling that comes and going; this is noticed with maintaining one position for Pavlov periods of time. She has not taken any treatment. She did use [employer] one day which was effective.   She reports that "she was in line for a gastric sleeve". She  has been seen by Twana First at [facility]. She reports that smoking has hindered her form the surgery. She has failed; patch  and gum.   Past Medical History:  Diagnosis Date   Allergy    SEASONAL   Bell's palsy 05/08/2014   Bell's palsy 2016   Bell's palsy    Hidradenitis axillaris     Past Surgical History:  Procedure Laterality Date   CESAREAN SECTION  02/11/2009   CESAREAN SECTION N/A 06/17/2015   Procedure: REPEAT CESAREAN SECTION;  Surgeon: Conard Novak, MD;  Location: [facility] ORS;  Service: Obstetrics;  Laterality: N/A;   HYDRADENITIS EXCISION Left 08/10/2018   Procedure: EXCISION OF LEFT AXILLARY HIDRADENITIS;  Surgeon: Berna Bue, MD;  Location: [facility] OR;  Service: General;  Laterality: Left;    Family History  Problem Relation Age of Onset   Miscarriages / [place] Mother    Alcohol abuse Father    Asthma Father    Diabetes Father    Hypertension Maternal Grandmother    Stroke Maternal Grandmother    Heart disease Maternal Grandfather     Social History   Socioeconomic History   Marital status: Single    Spouse name: Not on file   Number of children: Not on file   Years of education: Not on file   Highest education level: Not on file  Occupational History   Not on file  Tobacco Use   Smoking status: Every Day    Packs/day: 0.50    Types: Cigarettes   Smokeless tobacco: Never  Vaping Use   Vaping Use: Never  used  Substance and Sexual Activity   Alcohol use: Yes    Alcohol/week: 2.0 standard drinks    Types: 2 Glasses of wine per week    Comment: every 2 to 3 weeks   Drug use: No   Sexual activity: Yes    Birth control/protection: Condom, Implant  Other Topics Concern   Not on file  Social History Narrative   Not on file   Social Determinants of Health   Financial Resource Strain: Not on file  Food Insecurity: Not on file  Transportation Needs: Not on file  Physical Activity: Not on file  Stress: Not on file  Social Connections: Not on file  Intimate Partner Violence: Not on file    ROS Review of Systems  Objective:   Today's Vitals: BP 128/77     Pulse (!) 103    Temp 98 F (36.7 C)    Ht 5\' 3"  (1.6 m)    Wt 271 lb (122.9 kg)    SpO2 99%    BMI 48.01 kg/m   Physical Exam Constitutional:      Appearance: She is obese.  HENT:     Head: Normocephalic and atraumatic.     Right Ear: Tympanic membrane normal.     Left Ear: Tympanic membrane normal.     Nose: Nose normal.     Mouth/Throat:     Mouth: Mucous membranes are moist.  Eyes:     Extraocular Movements: Extraocular movements intact.     Pupils: Pupils are equal, round, and reactive to light.  Cardiovascular:     Rate and Rhythm: Normal rate and regular rhythm.     Pulses: Normal pulses.     Heart sounds: Normal heart sounds.  Pulmonary:     Effort: Pulmonary effort is normal.     Breath sounds: Normal breath sounds.  Abdominal:     Palpations: Abdomen is soft.     Comments: Increased abdominal girth hypoactive  Musculoskeletal:        General: Normal range of motion.     Cervical back: Normal range of motion.     Right lower leg: No edema.     Left lower leg: No edema.  Skin:    General: Skin is warm and dry.     Capillary Refill: Capillary refill takes less than 2 seconds.  Neurological:     General: No focal deficit present.     Mental Status: She is alert and oriented to person, place, and time.  Psychiatric:        Mood and Affect: Mood normal.        Behavior: Behavior normal.        Thought Content: Thought content normal.        Judgment: Judgment normal.    Assessment & Plan:   Problem List Items Addressed This Visit       Musculoskeletal and Integument   Hidradenitis suppurativa Stable      Other   Class 3 severe obesity due to excess calories without serious comorbidity with body mass index (BMI) of 45.0 to 49.9 in adult [facility]) Obesity with BMI and comorbidities as noted above.  Discussed proper diet (low fat, low sodium, high fiber) with patient.   Discussed need for regular exercise (3 times per week, 20 minutes per session) with  patient.    Relevant Orders   TSH   Tobacco use   Other Visit Diagnoses     Encounter to establish care    -  Primary Discussed female health maintenance; SBE, annual CBE, PAP test Discussed general safety in vehicle and COVID Discussed regular hydration with water Discussed healthy diet and exercise and weight management Discussed sexual health  Discussed mental health Encouraged to call our office for an appointment with in ongoing concerns for questions.     Relevant Orders   POCT URINALYSIS DIP (CLINITEK)   HgB A1c   Right knee pain, unspecified chronicity     Education    Relevant Orders   DG Knee Complete 4 Views Right   Prediabetes     Consider home glucose monitoring Weight loss at least 5% of current body weight is can be achieved with lifestyle modification dietary changes and regular daily exercise Encourage blood pressure control goal <120/80 and maintaining total cholesterol <200 Follow-up every 3 to 6 months for reevaluation Education material provided    Relevant Orders   Comp. Metabolic Panel (12)   Lipid panel   Screening for cholesterol level       Relevant Orders   Lipid panel   Screening for thyroid disorder           Outpatient Encounter Medications as of 03/25/2021  Medication Sig   cetirizine (ZYRTEC) 10 MG tablet Take 20 mg by mouth 2 (two) times daily as needed (allergic reaction).   doxycycline (VIBRA-TABS) 100 MG tablet Take 1 tablet (100 mg total) by mouth 2 (two) times daily.   metFORMIN (GLUCOPHAGE) 500 MG tablet Take 1 tablet (500 mg total) by mouth daily with breakfast.   valACYclovir (VALTREX) 500 MG tablet Take 1 tablet (500 mg total) by mouth 2 (two) times daily as needed (herpes outbreak). Take 500 mg twice daily for 3 days at onset of flare.   Varenicline Tartrate, Starter, 0.5 MG X 11 & 1 MG X 42 TBPK Take 1 tablet by mouth as directed.   clotrimazole-betamethasone (LOTRISONE) lotion Apply topically 2 (two) times daily. If rash no  better after ~2 weeks of use, return to clinic (Patient not taking: Reported on 03/25/2021)   etonogestrel (NEXPLANON) 68 MG IMPL implant 1 each (68 mg total) by Subdermal route once for 1 dose.   ibuprofen (ADVIL) 200 MG tablet Take 600 mg by mouth every 6 (six) hours as needed for moderate pain. (Patient not taking: Reported on 03/25/2021)   No facility-administered encounter medications on file as of 03/25/2021.    Follow-up: Return in about 3 months (around 06/22/2021).   Barbette Merino, NP

## 2021-03-25 NOTE — Patient Instructions (Addendum)
Preventive Care 21-37 Years Old, Female °Preventive care refers to lifestyle choices and visits with your health care provider that can promote health and wellness. Preventive care visits are also called wellness exams. °What can I expect for my preventive care visit? °Counseling °During your preventive care visit, your health care provider may ask about your: °Medical history, including: °Past medical problems. °Family medical history. °Pregnancy history. °Current health, including: °Menstrual cycle. °Method of birth control. °Emotional well-being. °Home life and relationship well-being. °Sexual activity and sexual health. °Lifestyle, including: °Alcohol, nicotine or tobacco, and drug use. °Access to firearms. °Diet, exercise, and sleep habits. °Work and work environment. °Sunscreen use. °Safety issues such as seatbelt and bike helmet use. °Physical exam °Your health care provider may check your: °Height and weight. These may be used to calculate your BMI (body mass index). BMI is a measurement that tells if you are at a healthy weight. °Waist circumference. This measures the distance around your waistline. This measurement also tells if you are at a healthy weight and may help predict your risk of certain diseases, such as type 2 diabetes and high blood pressure. °Heart rate and blood pressure. °Body temperature. °Skin for abnormal spots. °What immunizations do I need? °Vaccines are usually given at various ages, according to a schedule. Your health care provider will recommend vaccines for you based on your age, medical history, and lifestyle or other factors, such as travel or where you work. °What tests do I need? °Screening °Your health care provider may recommend screening tests for certain conditions. This may include: °Pelvic exam and Pap test. °Lipid and cholesterol levels. °Diabetes screening. This is done by checking your blood sugar (glucose) after you have not eaten for a while (fasting). °Hepatitis B  test. °Hepatitis C test. °HIV (human immunodeficiency virus) test. °STI (sexually transmitted infection) testing, if you are at risk. °BRCA-related cancer screening. This may be done if you have a family history of breast, ovarian, tubal, or peritoneal cancers. °Talk with your health care provider about your test results, treatment options, and if necessary, the need for more tests. °Follow these instructions at home: °Eating and drinking ° °Eat a healthy diet that includes fresh fruits and vegetables, whole grains, lean protein, and low-fat dairy products. °Take vitamin and mineral supplements as recommended by your health care provider. °Do not drink alcohol if: °Your health care provider tells you not to drink. °You are pregnant, may be pregnant, or are planning to become pregnant. °If you drink alcohol: °Limit how much you have to 0-1 drink a day. °Know how much alcohol is in your drink. In the [place] one drink equals one 12 oz bottle of beer (355 mL), one 5 oz glass of wine (148 mL), or one 1½ oz glass of hard liquor (44 mL). °Lifestyle °Brush your teeth every morning and night with fluoride toothpaste. Floss one time each day. °Exercise for at least 30 minutes 5 or more days each week. °Do not use any products that contain nicotine or tobacco. These products include cigarettes, chewing tobacco, and vaping devices, such as e-cigarettes. If you need help quitting, ask your health care provider. °Do not use drugs. °If you are sexually active, practice safe sex. Use a condom or other form of protection to prevent STIs. °If you do not wish to become pregnant, use a form of birth control. If you plan to become pregnant, see your health care provider for a prepregnancy visit. °Find healthy ways to manage stress, such as: °Meditation, yoga,   or listening to music. Journaling. Talking to a trusted person. Spending time with friends and family. Minimize exposure to UV radiation to reduce your risk of skin  cancer. Safety Always wear your seat belt while driving or riding in a vehicle. Do not drive: If you have been drinking alcohol. Do not ride with someone who has been drinking. If you have been using any mind-altering substances or drugs. While texting. When you are tired or distracted. Wear a helmet and other protective equipment during sports activities. If you have firearms in your house, make sure you follow all gun safety procedures. Seek help if you have been physically or sexually abused. What's next? Go to your health care provider once a year for an annual wellness visit. Ask your health care provider how often you should have your eyes and teeth checked. Stay up to date on all vaccines. This information is not intended to replace advice given to you by your health care provider. Make sure you discuss any questions you have with your health care provider. Document Revised: 07/18/2020 Document Reviewed: 07/18/2020 Elsevier Patient Education  2022 [employer].  Prediabetes Eating Plan Prediabetes is a condition that causes blood sugar (glucose) levels to be higher than normal. This increases the risk for developing type 2 diabetes (type 2 diabetes mellitus). Working with a health care provider or nutrition specialist (dietitian) to make diet and lifestyle changes can help prevent the onset of diabetes. These changes may help you: Control your blood glucose levels. Improve your cholesterol levels. Manage your blood pressure. What are tips for following this plan? Reading food labels Read food labels to check the amount of fat, salt (sodium), and sugar in prepackaged foods. Avoid foods that have: Saturated fats. Trans fats. Added sugars. Avoid foods that have more than 300 milligrams (mg) of sodium per serving. Limit your sodium intake to less than 2,300 mg each day. Shopping Avoid buying pre-made and processed foods. Avoid buying drinks with added sugar. Cooking Cook with  olive oil. Do not use butter, lard, or ghee. Bake, broil, grill, steam, or boil foods. Avoid frying. Meal planning  Work with your dietitian to create an eating plan that is right for you. This may include tracking how many calories you take in each day. Use a food diary, notebook, or mobile application to track what you eat at each meal. Consider following a Mediterranean diet. This includes: Eating several servings of fresh fruits and vegetables each day. Eating fish at least twice a week. Eating one serving each day of whole grains, beans, nuts, and seeds. Using olive oil instead of other fats. Limiting alcohol. Limiting red meat. Using nonfat or low-fat dairy products. Consider following a plant-based diet. This includes dietary choices that focus on eating mostly vegetables and fruit, grains, beans, nuts, and seeds. If you have high blood pressure, you may need to limit your sodium intake or follow a diet such as the DASH (Dietary Approaches to Stop Hypertension) eating plan. The DASH diet aims to lower high blood pressure. Lifestyle Set weight loss goals with help from your health care team. It is recommended that most people with prediabetes lose 7% of their body weight. Exercise for at least 30 minutes 5 or more days a week. Attend a support group or seek support from a mental health counselor. Take over-the-counter and prescription medicines only as told by your health care provider. What foods are recommended? Fruits Berries. Bananas. Apples. Oranges. Grapes. Papaya. Mango. Pomegranate. Kiwi. Grapefruit. Cherries. Vegetables  Lettuce. Spinach. Peas. Beets. Cauliflower. Cabbage. Broccoli. Carrots. Tomatoes. Squash. Eggplant. Herbs. Peppers. Onions. Cucumbers. Brussels sprouts. Grains Whole grains, such as whole-wheat or whole-grain breads, crackers, cereals, and pasta. Unsweetened oatmeal. Bulgur. Barley. Quinoa. Brown rice. Corn or whole-wheat flour tortillas or taco shells. Meats  and other proteins Seafood. Poultry without skin. Lean cuts of pork and beef. Tofu. Eggs. Nuts. Beans. Dairy Low-fat or fat-free dairy products, such as yogurt, cottage cheese, and cheese. Beverages Water. Tea. Coffee. Sugar-free or diet soda. Seltzer water. Low-fat or nonfat milk. Milk alternatives, such as soy or almond milk. Fats and oils Olive oil. Canola oil. Sunflower oil. Grapeseed oil. Avocado. Walnuts. Sweets and desserts Sugar-free or low-fat pudding. Sugar-free or low-fat ice cream and other frozen treats. Seasonings and condiments Herbs. Sodium-free spices. Mustard. Relish. Low-salt, low-sugar ketchup. Low-salt, low-sugar barbecue sauce. Low-fat or fat-free mayonnaise. The items listed above may not be a complete list of recommended foods and beverages. Contact a dietitian for more information. What foods are not recommended? Fruits Fruits canned with syrup. Vegetables Canned vegetables. Frozen vegetables with butter or cream sauce. Grains Refined white flour and flour products, such as bread, pasta, snack foods, and cereals. Meats and other proteins Fatty cuts of meat. Poultry with skin. Breaded or fried meat. Processed meats. Dairy Full-fat yogurt, cheese, or milk. Beverages Sweetened drinks, such as iced tea and soda. Fats and oils Butter. Lard. Ghee. Sweets and desserts Baked goods, such as cake, cupcakes, pastries, cookies, and cheesecake. Seasonings and condiments Spice mixes with added salt. Ketchup. Barbecue sauce. Mayonnaise. The items listed above may not be a complete list of foods and beverages that are not recommended. Contact a dietitian for more information. Where to find more information American Diabetes Association: www.diabetes.org Summary You may need to make diet and lifestyle changes to help prevent the onset of diabetes. These changes can help you control blood sugar, improve cholesterol levels, and manage blood pressure. Set weight loss goals  with help from your health care team. It is recommended that most people with prediabetes lose 7% of their body weight. Consider following a Mediterranean diet. This includes eating plenty of fresh fruits and vegetables, whole grains, beans, nuts, seeds, fish, and low-fat dairy, and using olive oil instead of other fats. This information is not intended to replace advice given to you by your health care provider. Make sure you discuss any questions you have with your health care provider. Document Revised: 04/21/2019 Document Reviewed: 04/21/2019 Elsevier Patient Education  [place].  Knee Exercises Ask your health care provider which exercises are safe for you. Do exercises exactly as told by your health care provider and adjust them as directed. It is normal to feel mild stretching, pulling, tightness, or discomfort as you do these exercises. Stop right away if you feel sudden pain or your pain gets worse. Do not begin these exercises until told by your health care provider. Stretching and range-of-motion exercises These exercises warm up your muscles and joints and improve the movement and flexibility of your knee. These exercises also help to relieve pain and swelling. Knee extension, prone  Lie on your abdomen (prone position) on a bed. Place your left / right knee just beyond the edge of the surface so your knee is not on the bed. You can put a towel under your left / right thigh just above your kneecap for comfort. Relax your leg muscles and allow gravity to straighten your knee (extension). You should feel a stretch behind your  left / right knee. Hold this position for __________ seconds. Scoot up so your knee is supported between repetitions. Repeat __________ times. Complete this exercise __________ times a day. Knee flexion, active  Lie on your back with both legs straight. If this causes back discomfort, bend your left / right knee so your foot is flat on the floor. Slowly  slide your left / right heel back toward your buttocks. Stop when you feel a gentle stretch in the front of your knee or thigh (flexion). Hold this position for __________ seconds. Slowly slide your left / right heel back to the starting position. Repeat __________ times. Complete this exercise __________ times a day. Quadriceps stretch, prone  Lie on your abdomen on a firm surface, such as a bed or padded floor. Bend your left / right knee and hold your ankle. If you cannot reach your ankle or pant leg, loop a belt around your foot and grab the belt instead. Gently pull your heel toward your buttocks. Your knee should not slide out to the side. You should feel a stretch in the front of your thigh and knee (quadriceps). Hold this position for __________ seconds. Repeat __________ times. Complete this exercise __________ times a day. Hamstring, supine  Lie on your back (supine position). Loop a belt or towel over the ball of your left / right foot. The ball of your foot is on the walking surface, right under your toes. Straighten your left / right knee and slowly pull on the belt to raise your leg until you feel a gentle stretch behind your knee (hamstring). Do not let your knee bend while you do this. Keep your other leg flat on the floor. Hold this position for __________ seconds. Repeat __________ times. Complete this exercise __________ times a day. Strengthening exercises These exercises build strength and endurance in your knee. Endurance is the ability to use your muscles for a [place] time, even after they get tired. Quadriceps, isometric This exercise strengthens the muscles in front of your thigh (quadriceps) without moving your knee joint (isometric). Lie on your back with your left / right leg extended and your other knee bent. Put a rolled towel or small pillow under your knee if told by your health care provider. Slowly tense the muscles in the front of your left / right thigh. You  should see your kneecap slide up toward your hip or see increased dimpling just above the knee. This motion will push the back of the knee toward the floor. For __________ seconds, hold the muscle as tight as you can without increasing your pain. Relax the muscles slowly and completely. Repeat __________ times. Complete this exercise __________ times a day. Straight leg raises This exercise strengthens the muscles in front of your thigh (quadriceps) and the muscles that move your hips (hip flexors). Lie on your back with your left / right leg extended and your other knee bent. Tense the muscles in the front of your left / right thigh. You should see your kneecap slide up or see increased dimpling just above the knee. Your thigh may even shake a bit. Keep these muscles tight as you raise your leg 4-6 inches (10-15 cm) off the floor. Do not let your knee bend. Hold this position for __________ seconds. Keep these muscles tense as you lower your leg. Relax your muscles slowly and completely after each repetition. Repeat __________ times. Complete this exercise __________ times a day. Hamstring, isometric  Lie on your back on  a firm surface. Bend your left / right knee about __________ degrees. Dig your left / right heel into the surface as if you are trying to pull it toward your buttocks. Tighten the muscles in the back of your thighs (hamstring) to "dig" as hard as you can without increasing any pain. Hold this position for __________ seconds. Release the tension gradually and allow your muscles to relax completely for __________ seconds after each repetition. Repeat __________ times. Complete this exercise __________ times a day. Hamstring curls If told by your health care provider, do this exercise while wearing ankle weights. Begin with __________lb / kg weights. Then increase the weight by 1 lb (0.5 kg) increments. Do not wear ankle weights that are more than __________lb / kg. Lie on your  abdomen with your legs straight. Bend your left / right knee as far as you can without feeling pain. Keep your hips flat against the floor. Hold this position for __________ seconds. Slowly lower your leg to the starting position. Repeat __________ times. Complete this exercise __________ times a day. Squats This exercise strengthens the muscles in front of your thigh and knee (quadriceps). Stand in front of a table, with your feet and knees pointing straight ahead. You may rest your hands on the table for balance but not for support. Slowly bend your knees and lower your hips like you are going to sit in a chair. Keep your weight over your heels, not over your toes. Keep your lower legs upright so they are parallel with the table legs. Do not let your hips go lower than your knees. Do not bend lower than told by your health care provider. If your knee pain increases, do not bend as low. Hold the squat position for __________ seconds. Slowly push with your legs to return to standing. Do not use your hands to pull yourself to standing. Repeat __________ times. Complete this exercise __________ times a day. Wall slides This exercise strengthens the muscles in front of your thigh and knee (quadriceps). Lean your back against a smooth wall or door, and walk your feet out 18-24 inches (46-61 cm) from it. Place your feet hip-width apart. Slowly slide down the wall or door until your knees bend __________ degrees. Keep your knees over your heels, not over your toes. Keep your knees in line with your hips. Hold this position for __________ seconds. Repeat __________ times. Complete this exercise __________ times a day. Straight leg raises, side-lying This exercise strengthens the muscles that rotate the leg at the hip and move it away from your body (hip abductors). Lie on your side with your left / right leg in the top position. Lie so your head, shoulder, knee, and hip line up. You may bend your  bottom knee to help you keep your balance. Roll your hips slightly forward so your hips are stacked directly over each other and your left / right knee is facing forward. Leading with your heel, lift your top leg 4-6 inches (10-15 cm). You should feel the muscles in your outer hip lifting. Do not let your foot drift forward. Do not let your knee roll toward the ceiling. Hold this position for __________ seconds. Slowly return your leg to the starting position. Let your muscles relax completely after each repetition. Repeat __________ times. Complete this exercise __________ times a day. Straight leg raises, prone This exercise stretches the muscles that move your hips away from the front of the pelvis (hip extensors). Lie on your  abdomen on a firm surface. You can put a pillow under your hips if that is more comfortable. Tense the muscles in your buttocks and lift your left / right leg about 4-6 inches (10-15 cm). Keep your knee straight as you lift your leg. Hold this position for __________ seconds. Slowly lower your leg to the starting position. Let your leg relax completely after each repetition. Repeat __________ times. Complete this exercise __________ times a day. This information is not intended to replace advice given to you by your health care provider. Make sure you discuss any questions you have with your health care provider. Document Revised: 10/02/2020 Document Reviewed: 10/02/2020 Elsevier Patient Education  [employer].

## 2021-03-26 LAB — TSH: TSH: 207 u[IU]/mL — ABNORMAL HIGH (ref 0.450–4.500)

## 2021-03-26 LAB — COMP. METABOLIC PANEL (12)
AST: 81 IU/L — ABNORMAL HIGH (ref 0–40)
Albumin/Globulin Ratio: 1.6 (ref 1.2–2.2)
Albumin: 4.6 g/dL (ref 3.8–4.8)
Alkaline Phosphatase: 81 IU/L (ref 44–121)
BUN/Creatinine Ratio: 7 — ABNORMAL LOW (ref 9–23)
BUN: 9 mg/dL (ref 6–20)
Bilirubin Total: 0.3 mg/dL (ref 0.0–1.2)
Calcium: 9.4 mg/dL (ref 8.7–10.2)
Chloride: 101 mmol/L (ref 96–106)
Creatinine, Ser: 1.31 mg/dL — ABNORMAL HIGH (ref 0.57–1.00)
Globulin, Total: 2.9 g/dL (ref 1.5–4.5)
Glucose: 88 mg/dL (ref 70–99)
Potassium: 4.1 mmol/L (ref 3.5–5.2)
Sodium: 136 mmol/L (ref 134–144)
Total Protein: 7.5 g/dL (ref 6.0–8.5)
eGFR: 54 mL/min/{1.73_m2} — ABNORMAL LOW (ref >59)

## 2021-03-26 LAB — LIPID PANEL
Chol/HDL Ratio: 3.9 ratio (ref 0.0–4.4)
Cholesterol, Total: 222 mg/dL — ABNORMAL HIGH (ref 100–199)
HDL: 57 mg/dL (ref >39)
LDL Chol Calc (NIH): 131 mg/dL — ABNORMAL HIGH (ref 0–99)
Triglycerides: 195 mg/dL — ABNORMAL HIGH (ref 0–149)
VLDL Cholesterol Cal: 34 mg/dL (ref 5–40)

## 2021-03-28 ENCOUNTER — Other Ambulatory Visit: Payer: Self-pay | Admitting: Nurse Practitioner

## 2021-03-28 DIAGNOSIS — R7989 Other specified abnormal findings of blood chemistry: Secondary | ICD-10-CM

## 2021-03-30 ENCOUNTER — Other Ambulatory Visit: Payer: Self-pay | Admitting: Nurse Practitioner

## 2021-03-30 DIAGNOSIS — E039 Hypothyroidism, unspecified: Secondary | ICD-10-CM

## 2021-03-30 MED ORDER — LEVOTHYROXINE SODIUM 50 MCG PO TABS: 50.0000 ug | ORAL_TABLET | Freq: Every day | ORAL | 0 refills | Status: DC

## 2021-04-09 ENCOUNTER — Other Ambulatory Visit: Payer: Self-pay

## 2021-04-09 ENCOUNTER — Ambulatory Visit
Admission: RE | Admit: 2021-04-09 | Discharge: 2021-04-09 | Disposition: A | Discharge status: Home / Self Care | Payer: Self-pay | Source: Ambulatory Visit | Attending: Emergency Medicine | Admitting: Emergency Medicine

## 2021-04-09 VITALS — BP 115/81 | HR 100 | Temp 98.2°F | Resp 22

## 2021-04-09 DIAGNOSIS — L732 Hidradenitis suppurativa: Secondary | ICD-10-CM

## 2021-04-09 DIAGNOSIS — L02412 Cutaneous abscess of left axilla: Secondary | ICD-10-CM

## 2021-04-09 MED ORDER — DOXYCYCLINE HYCLATE 100 MG PO TABS: 100.0000 mg | ORAL_TABLET | Freq: Two times a day (BID) | ORAL | 0 refills | Status: DC

## 2021-04-09 NOTE — Discharge Instructions (Addendum)
Take the doxycycline as directed.  Encourage drainage from the abscesses.  Follow up with your primary care provider or surgeon as discussed.   ? ? ? ?

## 2021-04-09 NOTE — ED Provider Notes (Signed)
Renaldo Fiddler    CSN: [id number] Arrival date & time: 04/09/21  8453      History   Chief Complaint Chief Complaint  Patient presents with   Abscess    HPI Ashley Stanton is a 36 y.o. female.  Patient presents with 2 abscesses in her left axilla for 1 week. She has been taking doxycycline for several days but the abscesses are not improving.  No open wounds or drainage.  No fever, chills, or other symptoms.  She has history of hidradenitis suppurativa and has previously seen [facility] for abscess in left axilla.    The history is provided by the patient.   Past Medical History:  Diagnosis Date   Allergy    SEASONAL   Bell's palsy 05/08/2014   Bell's palsy 2016   Bell's palsy    Hidradenitis axillaris     Patient Active Problem List   Diagnosis Date Noted   Allergies 01/11/2019   Seasonal allergies 01/11/2019   Tobacco use 07/13/2018   Borderline diabetes 07/13/2018   HSV (herpes simplex virus) infection 04/01/2017   Insomnia 03/12/2016   Tinea pedis 03/12/2016   Class 3 severe obesity due to excess calories without serious comorbidity with body mass index (BMI) of 45.0 to 49.9 in adult [facility])    Hidradenitis suppurativa 07/14/2014    Past Surgical History:  Procedure Laterality Date   CESAREAN SECTION  02/11/2009   CESAREAN SECTION N/A 06/17/2015   Procedure: REPEAT CESAREAN SECTION;  Surgeon: Conard Novak, MD;  Location: [facility] ORS;  Service: Obstetrics;  Laterality: N/A;   HYDRADENITIS EXCISION Left 08/10/2018   Procedure: EXCISION OF LEFT AXILLARY HIDRADENITIS;  Surgeon: Berna Bue, MD;  Location: [facility] OR;  Service: General;  Laterality: Left;    OB History     Gravida  2   Para  2   Term  2   Preterm      AB      Living  2      SAB      IAB      Ectopic      Multiple  0   Live Births  2            Home Medications    Prior to Admission medications   Medication Sig Start Date End Date Taking? Authorizing  Provider  cetirizine (ZYRTEC) 10 MG tablet Take 20 mg by mouth 2 (two) times daily as needed (allergic reaction).    [provider]  clotrimazole-betamethasone (LOTRISONE) lotion Apply topically 2 (two) times daily. If rash no better after ~2 weeks of use, return to clinic Patient not taking: Reported on 03/25/2021 09/12/20   Valentino Nose, NP  doxycycline (VIBRA-TABS) 100 MG tablet Take 1 tablet (100 mg total) by mouth 2 (two) times daily. 04/09/21   Mickie Bail, NP  etonogestrel (NEXPLANON) 68 MG IMPL implant 1 each (68 mg total) by Subdermal route once for 1 dose. 05/29/20 09/12/20  Conard Novak, MD  ibuprofen (ADVIL) 200 MG tablet Take 600 mg by mouth every 6 (six) hours as needed for moderate pain. Patient not taking: Reported on 03/25/2021    [provider]  levothyroxine (SYNTHROID) 50 MCG tablet Take 1 tablet (50 mcg total) by mouth daily. 03/30/21   Barbette Merino, NP  metFORMIN (GLUCOPHAGE) 500 MG tablet Take 1 tablet (500 mg total) by mouth daily with breakfast. 11/18/19   Salley Scarlet, MD  valACYclovir (VALTREX) 500 MG  tablet Take 1 tablet (500 mg total) by mouth 2 (two) times daily as needed (herpes outbreak). Take 500 mg twice daily for 3 days at onset of flare. 09/12/20   Valentino Nose, NP  Varenicline Tartrate, Starter, 0.5 MG X 11 & 1 MG X 42 TBPK Take 1 tablet by mouth as directed. 03/25/21   Barbette Merino, NP    Family History Family History  Problem Relation Age of Onset   Miscarriages / Stillbirths Mother    Alcohol abuse Father    Asthma Father    Diabetes Father    Hypertension Maternal Grandmother    Stroke Maternal Grandmother    Heart disease Maternal Grandfather     Social History Social History   Tobacco Use   Smoking status: Every Day    Packs/day: 0.50    Types: Cigarettes   Smokeless tobacco: Never  Vaping Use   Vaping Use: Never used  Substance Use Topics   Alcohol use: Yes    Alcohol/week: 2.0 standard drinks     Types: 2 Glasses of wine per week    Comment: every 2 to 3 weeks   Drug use: No     Allergies   Alpha-gal and Pineapple   Review of Systems Review of Systems  Constitutional:  Negative for chills and fever.  Musculoskeletal:  Negative for arthralgias and joint swelling.  Skin:  Positive for wound. Negative for color change.  All other systems reviewed and are negative.   Physical Exam Triage Vital Signs ED Triage Vitals  Enc Vitals Group     BP 04/09/21 0853 115/81     Pulse Rate 04/09/21 0853 100     Resp 04/09/21 0853 (!) 22     Temp 04/09/21 0853 98.2 F (36.8 C)     Temp src --      SpO2 04/09/21 0853 99 %     Weight --      Height --      Head Circumference --      Peak Flow --      Pain Score 04/09/21 0854 6     Pain Loc --      Pain Edu? --      Excl. in GC? --    No data found.  Updated Vital Signs BP 115/81    Pulse 100    Temp 98.2 F (36.8 C)    Resp (!) 22    SpO2 99%   Visual Acuity Right Eye Distance:   Left Eye Distance:   Bilateral Distance:    Right Eye Near:   Left Eye Near:    Bilateral Near:     Physical Exam Vitals and nursing note reviewed.  Constitutional:      General: She is not in acute distress.    Appearance: She is well-developed. She is not ill-appearing.  HENT:     Mouth/Throat:     Mouth: Mucous membranes are moist.  Cardiovascular:     Rate and Rhythm: Normal rate and regular rhythm.     Heart sounds: Normal heart sounds.  Pulmonary:     Effort: Pulmonary effort is normal. No respiratory distress.     Breath sounds: Normal breath sounds.  Musculoskeletal:     Cervical back: Neck supple.  Skin:    General: Skin is warm and dry.     Comments: Two soft fluctuant tender abscesses in left axilla; approximately 4 cm x 3 cm and 2 cm x 3 cm. No open wounds  or drainage.   Neurological:     Mental Status: She is alert.  Psychiatric:        Mood and Affect: Mood normal.        Behavior: Behavior normal.     UC  Treatments / Results  Labs (all labs ordered are listed, but only abnormal results are displayed) Labs Reviewed - No data to display  EKG   Radiology No results found.  Procedures Incision and Drainage  Date/Time: 04/09/2021 9:30 AM Performed by: Mickie Bail, NP Authorized by: Mickie Bail, NP   Consent:    Consent obtained:  Verbal   Consent given by:  Patient   Risks discussed:  Bleeding, incomplete drainage, pain and infection Universal protocol:    Procedure explained and questions answered to patient or proxy's satisfaction: yes   Location:    Type:  Abscess   Location: left axilla. Pre-procedure details:    Skin preparation:  Antiseptic wash Anesthesia:    Anesthesia method:  Local infiltration   Local anesthetic:  Lidocaine 1% w/o epi Procedure type:    Complexity:  Simple Procedure details:    Incision types:  Single straight   Drainage:  Purulent   Drainage amount:  Copious   Wound treatment:  Wound left open   Packing materials:  None Post-procedure details:    Procedure completion:  Tolerated well, no immediate complications (including critical care time)  Medications Ordered in UC Medications - No data to display  Initial Impression / Assessment and Plan / UC Course  I have reviewed the triage vital signs and the nursing notes.  Pertinent labs & imaging results that were available during my care of the patient were reviewed by me and considered in my medical decision making (see chart for details).   Abscesses of left axilla.  I&D performed with copious purulent drainage.  Refill of doxycycline given and instructed patient to take for total of 10 days.  Wound care instructions discussed.  Instructed patient to follow-up with her [facility] who she has seen previously.  She agrees to plan of care.   Final Clinical Impressions(s) / UC Diagnoses   Final diagnoses:  Abscess of axilla, left     Discharge Instructions      Take the  doxycycline as directed.  Encourage drainage from the abscesses.  Follow up with your primary care provider or surgeon as discussed.          ED Prescriptions     Medication Sig Dispense Auth. Provider   doxycycline (VIBRA-TABS) 100 MG tablet Take 1 tablet (100 mg total) by mouth 2 (two) times daily. 30 tablet Mickie Bail, NP      PDMP not reviewed this encounter.   Mickie Bail, NP 04/09/21 [phone]

## 2021-04-09 NOTE — ED Triage Notes (Signed)
Pt here with left axilla abscess x 1 week.  ?

## 2021-04-17 ENCOUNTER — Other Ambulatory Visit: Payer: Self-pay | Admitting: Surgery

## 2021-04-17 ENCOUNTER — Other Ambulatory Visit (HOSPITAL_COMMUNITY): Payer: Self-pay | Admitting: Surgery

## 2021-04-19 ENCOUNTER — Inpatient Hospital Stay: Admission: RE | Admit: 2021-04-19 | Payer: Self-pay | Source: Ambulatory Visit

## 2021-04-20 LAB — T4, FREE: Free T4: 0.24 ng/dL — ABNORMAL LOW (ref 0.82–1.77)

## 2021-04-20 LAB — T3 UPTAKE: T3 Uptake Ratio: 18 % — ABNORMAL LOW (ref 24–39)

## 2021-04-20 LAB — SPECIMEN STATUS REPORT

## 2021-04-20 LAB — T3, FREE: T3, Free: <0.3 pg/mL — ABNORMAL LOW (ref 2.0–4.4)

## 2021-05-16 ENCOUNTER — Encounter: Payer: Self-pay | Admitting: Skilled Nursing Facility1

## 2021-05-16 ENCOUNTER — Encounter: Payer: Medicaid Other | Attending: Surgery | Admitting: Skilled Nursing Facility1

## 2021-05-16 DIAGNOSIS — Z713 Dietary counseling and surveillance: Secondary | ICD-10-CM | POA: Insufficient documentation

## 2021-05-16 DIAGNOSIS — Z6841 Body Mass Index (BMI) 40.0 and over, adult: Secondary | ICD-10-CM | POA: Insufficient documentation

## 2021-05-16 NOTE — Progress Notes (Signed)
Supervised Weight Loss Visit ?Bariatric Nutrition Education ? ?Planned Surgery: sleeve ? ?1 out of 5 SWL ? ? ?Anthropometrics  ?Start weight at [facility]: 253 lbs self-report from virtual appt (date: 02/20/2020) ?Today's weight: 263.1 lbs ?BMI: 46.61 kg/m2   ? ?Clinical  ?Medical Hx: post partum depression, hypo thyroid  ?Medications: see list: synthroid, metformin  ?Labs: 03/25/2021 A1C 6.2, Glucose 172,  cholesterol 222, triglycerides 195, LDL 131, creatinine 1.31, GFR 54, BUN/creatinine ratio 7, AST 81 ?Notable Signs/Symptoms: headaches, hx bells palsy, Pineapple allergy  ? ?Lifestyle & Dietary Hx ? ?Pt currently smoking stating she did not go to the cessation classes.  ?Pt states she socially drinks on the weekend, using the [employer] drinking. Pt has lost a couple of family members and drinks with her siblings/cousins to cope with the losses.  Pt states she is having a hard time coping. ?Pt states she is switching insurance and may not need 5 supervised visits with the new insurance.  Pt realizes that right now she is not in the right frame of mind if surgery happens sooner without the time given for more supervised visits, due to her resent losses and having trouble coping. ?After description of labs patient realizes that she needs to make some changes with alcohol.  Pt states she needs and vice, but still trying to quit smoking.  She states she needs help to cope without substances, and will seek help from a therapist. ? ?Focus of today's visit was to help pt begin to heal and get in a better place, so she can have a foundation to make changes for a healthier lifestyle nutrition. ? ?  ?24-Hr Dietary Recall: ?First Meal:  Protein shake ?Snack: none ?Second Meal:  Lunch prepared at home ?Snack: none ?Third Meal chicken, rice, broccoli ?Snack: ?Beverages: water ?  ?Estimated Energy Needs ?Calories:1500  ? ? ?NUTRITION DIAGNOSIS  ?Overweight/obesity ([place]-3.3) related to past poor dietary habits and physical inactivity as  evidenced by patient w/ planned sleeve surgery following dietary guidelines for continued weight loss. ?  ? ?NUTRITION INTERVENTION  ?Nutrition counseling (C-1) and education (E-2) to facilitate bariatric surgery goals. ? ?Goals Previously Chosen: ?Continue - Track food and beverage intake (pen and paper, MyFitness Pal, Baritastic app, etc.) ?Continue - Make healthy food choices while monitoring portion sizes: limit eating out to once a week ?New: Focus of today's visit was to help pt begin to heal and get in a better place, so she can have a foundation to make changes for a healthier lifestyle nutrition. ?New: Seek help from a therapist ? ?  ?Handouts Provided Include  ?None ? ?Learning Style & Readiness for Change ?Teaching method utilized: Visual & Auditory  ?Demonstrated degree of understanding via: Teach Back  ?Readiness Level: action ?Barriers to learning/adherence to lifestyle change: none identified at this time ? ?RD's Notes for Next Visit ?Assess pts adherence to chosen goals ?  ?  ?MONITORING & EVALUATION ?Dietary intake, weekly physical activity, body weight, and pre-op goals reached at next nutrition visit.  ?  ?Next Steps  ?Patient is to follow up at [facility] for next SWL visit ?

## 2021-05-27 ENCOUNTER — Ambulatory Visit
Admission: RE | Admit: 2021-05-27 | Discharge: 2021-05-27 | Disposition: A | Discharge status: Home / Self Care | Payer: Medicaid Other | Source: Ambulatory Visit | Attending: Surgery | Admitting: Surgery

## 2021-06-13 ENCOUNTER — Encounter: Payer: Self-pay | Admitting: Skilled Nursing Facility1

## 2021-06-13 ENCOUNTER — Encounter: Payer: Managed Care, Other (non HMO) | Attending: Surgery | Admitting: Skilled Nursing Facility1

## 2021-06-13 DIAGNOSIS — E669 Obesity, unspecified: Secondary | ICD-10-CM | POA: Insufficient documentation

## 2021-06-13 NOTE — Progress Notes (Signed)
Supervised Weight Loss Visit ?Bariatric Nutrition Education ? ?Planned Surgery: sleeve ? ?1 out of 5 SWL ? ?Appt conducted virtually: pt identified by name and DOB, pt agreeable to the limitations of this visit type ? ? ?Anthropometrics  ?Start weight at [facility]: 253 lbs self-report from virtual appt (date: 02/20/2020) ?Today's weight: 263.1 lbs ?BMI: 46.61 kg/m2   ? ?Clinical  ?Medical Hx: post partum depression, hypo thyroid  ?Medications: see list: synthroid, metformin  ?Labs: 03/25/2021 A1C 6.2, Glucose 172,  cholesterol 222, triglycerides 195, LDL 131, creatinine 1.31, GFR 54, BUN/creatinine ratio 7, AST 81 ?Notable Signs/Symptoms: headaches, hx bells palsy, Pineapple allergy  ? ?Lifestyle & Dietary Hx ? ?Pt states she has Been talking to a therapist and feeling really good about everything. Pt states now that she is taking metformin daily she doe snot crave chocolate any longer. Pt state she is trying to drink more water throughout the way. Pt states he started working out once per week a now helping with anxiety.  ?Pt states she is working with her cousin who is also trying to lose weight. Pt state she meets her best friend at the gym which has kept her motivated to keep going each week, Pt states every morning she preps her lemon water and feels it is a solid routine along with having a salad for lunch.   ? ?Pt states she only had 1 glass of wine since the last meeting no longer feeling compelled to binge drink.  ? ?Pt states she has continued to smoke.  ? ?  ?24-Hr Dietary Recall: ?First Meal:  Protein shake or doughnut ?Snack: none ?Second Meal: salad: salad bar from grocery store: chef style usually with side of fruit ?Snack: none ?Third Meal chicken, rice, broccoli or taco salad ?Snack: ?Beverages: lemon water, coffee + cream  + sugar, wine ? ?Physical activity: 1 days a week 2 hours ?  ?Estimated Energy Needs ?Calories:1500  ? ? ?NUTRITION DIAGNOSIS  ?Overweight/obesity ([place]-3.3) related to past poor dietary  habits and physical inactivity as evidenced by patient w/ planned sleeve surgery following dietary guidelines for continued weight loss. ?  ? ?NUTRITION INTERVENTION  ?Nutrition counseling (C-1) and education (E-2) to facilitate bariatric surgery goals. ? ?Goals Previously Chosen: ?Continue - Track food and beverage intake (pen and paper, MyFitness Pal, Baritastic app, etc.) ?Continue - Make healthy food choices while monitoring portion sizes: limit eating out to once a week ?continue: Focus of today's visit was to help pt begin to heal and get in a better place, so she can have a foundation to make changes for a healthier lifestyle nutrition ?continue: Seek help from a therapist (working with a  therapist now) ?NEW: eat breakfast try some of the ideas offered  ?NEW: work on quitting smoking ? ?  ?Handouts Provided Include  ? ? ?Learning Style & Readiness for Change ?Teaching method utilized: Visual & Auditory  ?Demonstrated degree of understanding via: Teach Back  ?Readiness Level: action ?Barriers to learning/adherence to lifestyle change: none identified at this time ? ?RD's Notes for Next Visit ?Assess pts adherence to chosen goals ?  ?  ?MONITORING & EVALUATION ?Dietary intake, weekly physical activity, body weight, and pre-op goals reached at next nutrition visit.  ?  ?Next Steps  ?Patient is to follow up at [facility] for next SWL visit ?

## 2021-06-24 ENCOUNTER — Ambulatory Visit: Payer: Self-pay | Admitting: Nurse Practitioner

## 2021-06-24 ENCOUNTER — Ambulatory Visit (INDEPENDENT_AMBULATORY_CARE_PROVIDER_SITE_OTHER): Payer: Managed Care, Other (non HMO) | Admitting: Nurse Practitioner

## 2021-06-24 ENCOUNTER — Encounter: Payer: Self-pay | Admitting: Nurse Practitioner

## 2021-06-24 DIAGNOSIS — E039 Hypothyroidism, unspecified: Secondary | ICD-10-CM

## 2021-06-24 DIAGNOSIS — B009 Herpesviral infection, unspecified: Secondary | ICD-10-CM | POA: Diagnosis not present

## 2021-06-24 DIAGNOSIS — R7303 Prediabetes: Secondary | ICD-10-CM | POA: Diagnosis not present

## 2021-06-24 MED ORDER — METFORMIN HCL 500 MG PO TABS: 500.0000 mg | ORAL_TABLET | Freq: Every day | ORAL | 1 refills | Status: DC

## 2021-06-24 MED ORDER — LEVOTHYROXINE SODIUM 50 MCG PO TABS: 50.0000 ug | ORAL_TABLET | Freq: Every day | ORAL | 0 refills | Status: AC

## 2021-06-24 MED ORDER — VALACYCLOVIR HCL 500 MG PO TABS: 500.0000 mg | ORAL_TABLET | Freq: Two times a day (BID) | ORAL | 0 refills | Status: AC | PRN

## 2021-06-24 NOTE — Patient Instructions (Addendum)
1. Acquired hypothyroidism  - levothyroxine (SYNTHROID) 50 MCG tablet; Take 1 tablet (50 mcg total) by mouth daily.  Dispense: 90 tablet; Refill: 0  2. HSV (herpes simplex virus) infection  - valACYclovir (VALTREX) 500 MG tablet; Take 1 tablet (500 mg total) by mouth 2 (two) times daily as needed (herpes outbreak). Take 500 mg twice daily for 3 days at onset of flare.  Dispense: 30 tablet; Refill: 0  3. Prediabetes  - metFORMIN (GLUCOPHAGE) 500 MG tablet; Take 1 tablet (500 mg total) by mouth daily with breakfast.  Dispense: 90 tablet; Refill: 1  Follow up:  Follow up in 3 months

## 2021-06-24 NOTE — Progress Notes (Signed)
Virtual Visit via Telephone Note  I connected with Ashley Stanton on 06/24/21 at  9:40 AM EDT by telephone and verified that I am speaking with the correct person using two identifiers.  Location: Patient: home Provider: office   I discussed the limitations, risks, security and privacy concerns of performing an evaluation and management service by telephone and the availability of in person appointments. I also discussed with the patient that there may be a patient responsible charge related to this service. The patient expressed understanding and agreed to proceed.   History of Present Illness:  Ashley Stanton presents to establish care. She  has a past medical history of Allergy, Bell's palsy (05/08/2014), Bell's palsy (2016), Bell's palsy, and Hidradenitis axillaris, hypothyroidism.   Patient presents today for a follow-up visit through telephone visit.  Patient states that she has been doing well.  Patient has been compliant with medications but states that she has been out of her metformin for the past couple weeks.  We discussed that we can refill this for her today.  Patient will need lab work completed at next visit.  It was noted that patient's cholesterol was elevated at last visit we discussed that she can continue low-cholesterol diet and we will recheck this at her next visit. Denies f/c/s, n/v/d, hemoptysis, PND, chest pain or edema.      Observations/Objective:     05/16/2021    4:54 PM 04/09/2021    8:53 AM 03/25/2021    9:49 AM  Vitals with BMI  Height 5\' 3"   5\' 3"   Weight 271 lbs 5 oz    BMI 48.07    Systolic  115 128  Diastolic  81 77  Pulse  100 103      Assessment and Plan:  1. Acquired hypothyroidism  - levothyroxine (SYNTHROID) 50 MCG tablet; Take 1 tablet (50 mcg total) by mouth daily.  Dispense: 90 tablet; Refill: 0  2. HSV (herpes simplex virus) infection  - valACYclovir (VALTREX) 500 MG tablet; Take 1 tablet (500 mg total) by mouth 2 (two) times daily  as needed (herpes outbreak). Take 500 mg twice daily for 3 days at onset of flare.  Dispense: 30 tablet; Refill: 0  3. Prediabetes  - metFORMIN (GLUCOPHAGE) 500 MG tablet; Take 1 tablet (500 mg total) by mouth daily with breakfast.  Dispense: 90 tablet; Refill: 1  Follow up:  Follow up in 3 months   I discussed the assessment and treatment plan with the patient. The patient was provided an opportunity to ask questions and all were answered. The patient agreed with the plan and demonstrated an understanding of the instructions.   The patient was advised to call back or seek an in-person evaluation if the symptoms worsen or if the condition fails to improve as anticipated.  I provided 24 minutes of non-face-to-face time during this encounter.   , NP

## 2021-08-08 LAB — T4, FREE: Free T4: 1.25 ng/dL (ref 0.82–1.77)

## 2021-08-08 LAB — T3: T3, Total: 213 ng/dL — ABNORMAL HIGH (ref 71–180)

## 2021-08-08 LAB — TSH: TSH: 1.95 u[IU]/mL (ref 0.450–4.500)

## 2021-08-29 ENCOUNTER — Ambulatory Visit: Payer: BC Managed Care – PPO | Admitting: Nurse Practitioner

## 2021-08-30 ENCOUNTER — Ambulatory Visit: Payer: BC Managed Care – PPO | Admitting: Internal Medicine

## 2021-09-23 ENCOUNTER — Ambulatory Visit: Payer: Self-pay | Admitting: Nurse Practitioner

## 2021-10-06 IMAGING — RF DG UGI W/ HIGH DENSITY W/O KUB
12 of 13 series · 15 of 16 positions shown · non-contrast
Comparison: No prior.

CLINICAL DATA: Morbid obesity.

EXAM:
UPPER GI SERIES WITHOUT KUB
TECHNIQUE: Routine upper GI series was performed with thin density barium.
FLUOROSCOPY TIME:  Fluoroscopy Time:  1 minutes 54 seconds
Radiation Exposure Index (if provided by the fluoroscopic device):
64.5 mGy

[Series 1: fluoro_barium 2fps_bw · 0.17mm/px · 1 of 1 slices shown (1 of 10)]
[im 1/1]
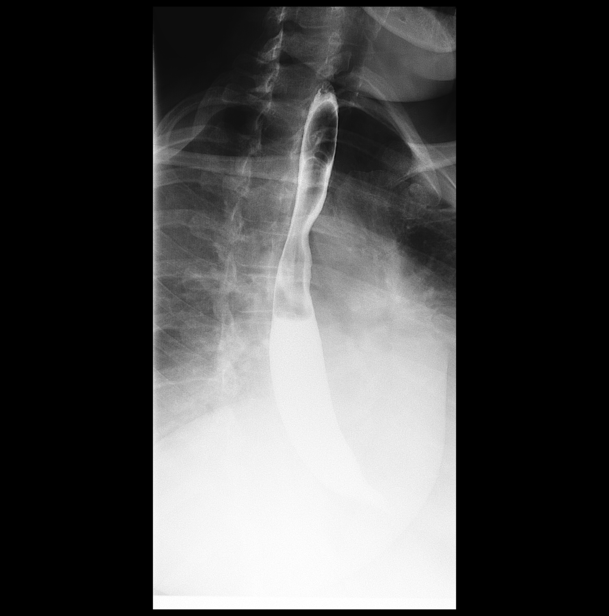

[Series 1: abdomen kub · 0.14mm/px · 1 of 1 slices shown (1 of 2)]
[im 1/1]
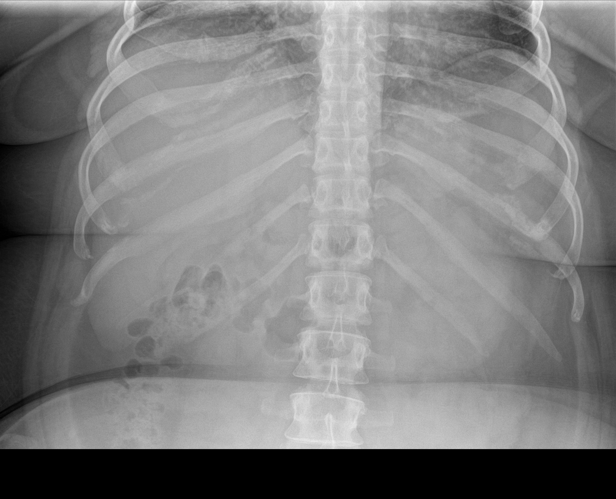

[Series 2: fluoro_barium 2fps_bw · 0.17mm/px · 2 of 2 frames shown (2 of 10)]
[frame 1/2]
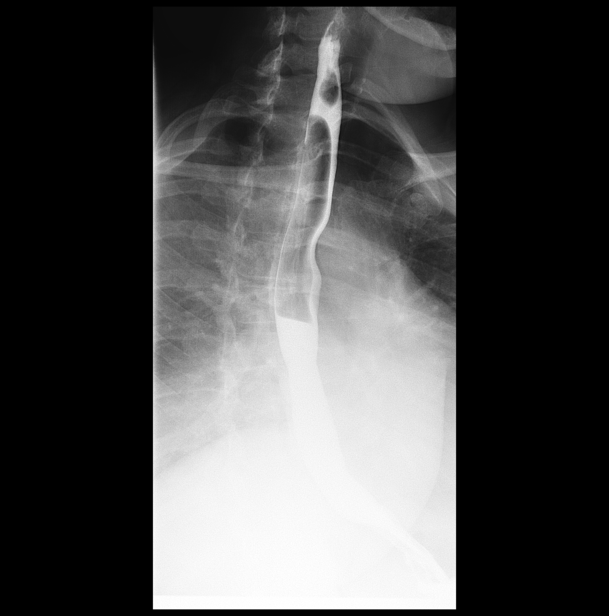
[frame 2/2]
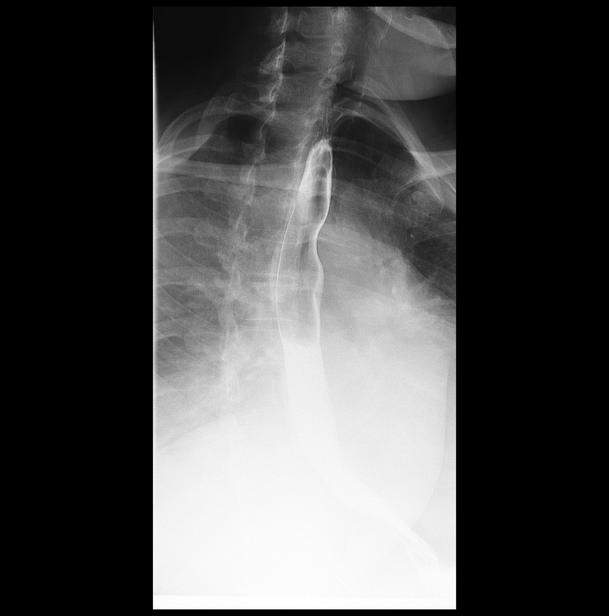

[Series 2: abdomen kub · 0.14mm/px · 1 of 1 slices shown (2 of 2)]
[im 1/1]
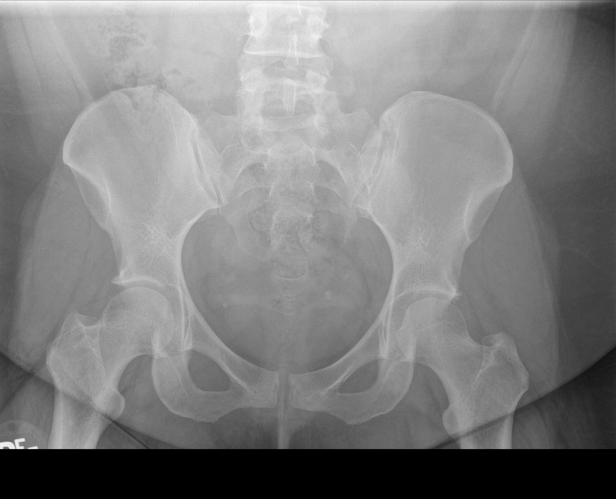

[Series 3: fluoro_barium 2fps_bw · 0.17mm/px · 1 of 1 slices shown (3 of 10)]
[im 1/1]
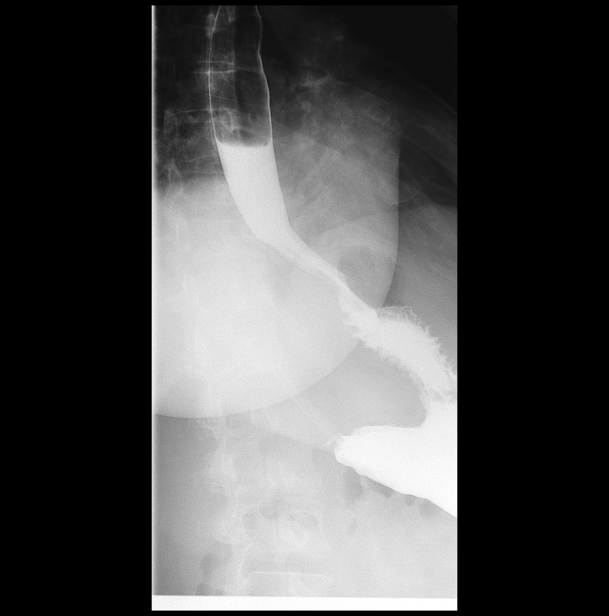

[Series 4: fluoro_barium 2fps_bw · 0.18mm/px · 1 of 1 slices shown (4 of 10)]
[im 1/1]
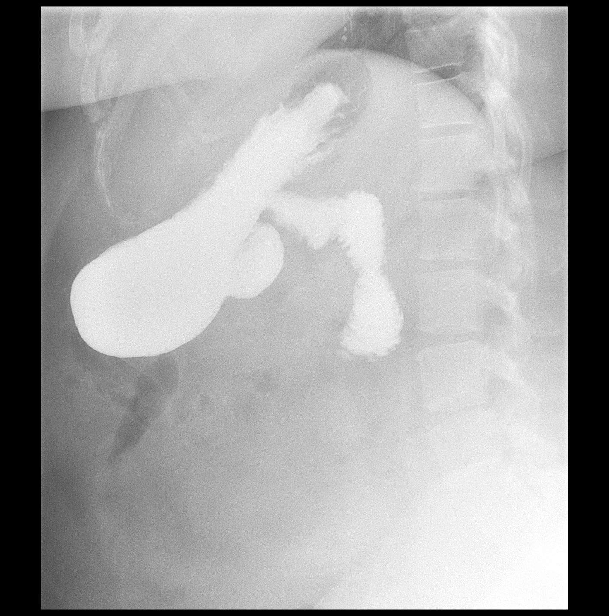

[Series 6: fluoro_barium 2fps_bw · 0.18mm/px · 1 of 1 slices shown (5 of 10)]
[im 1/1]
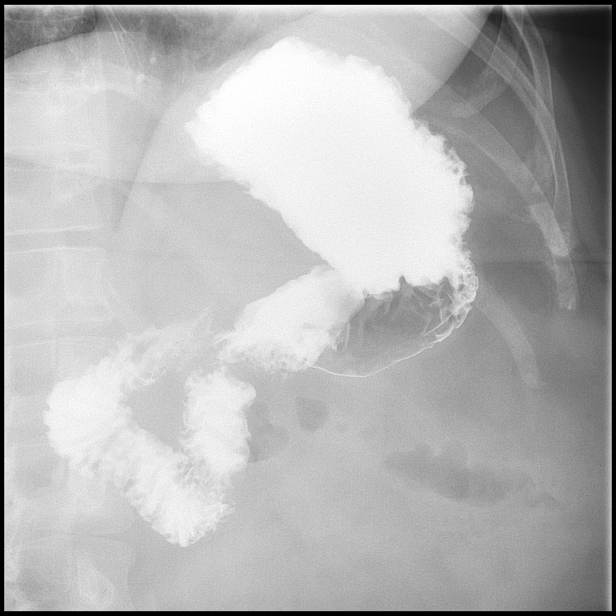

[Series 7: fluoro_barium 2fps_bw · 0.18mm/px · 1 of 1 slices shown (6 of 10)]
[im 1/1]
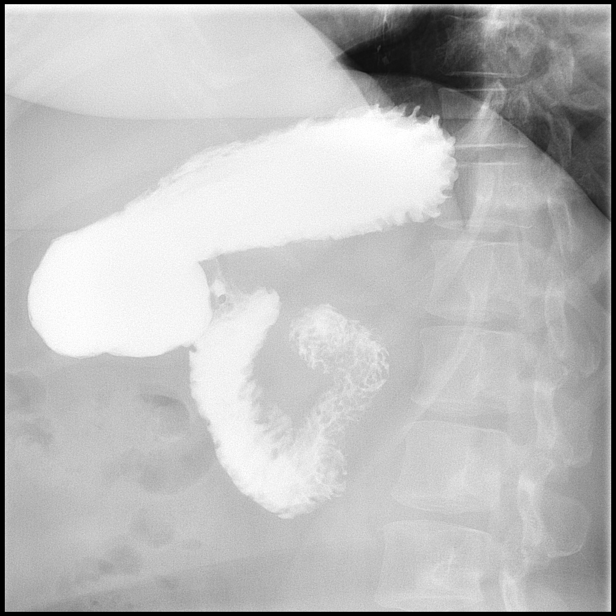

[Series 8: fluoro_barium 2fps_bw · 0.18mm/px · 1 of 1 slices shown (7 of 10)]
[im 1/1]
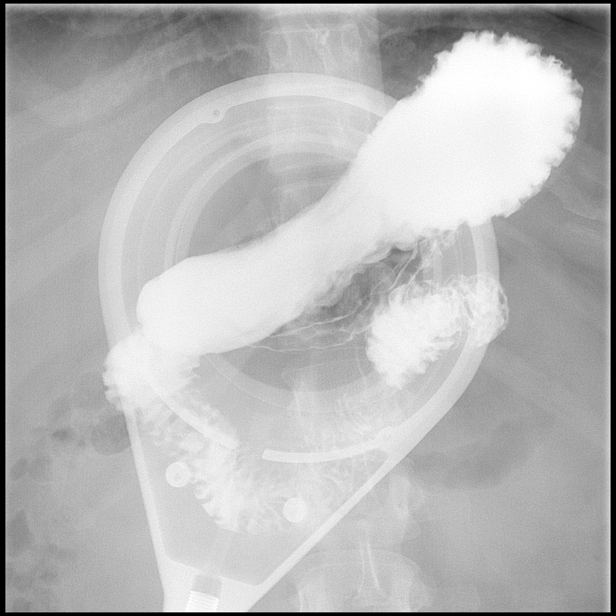

[Series 9: fluoro_barium 2fps_bw · 0.18mm/px · 1 of 1 slices shown (8 of 10)]
[im 1/1]
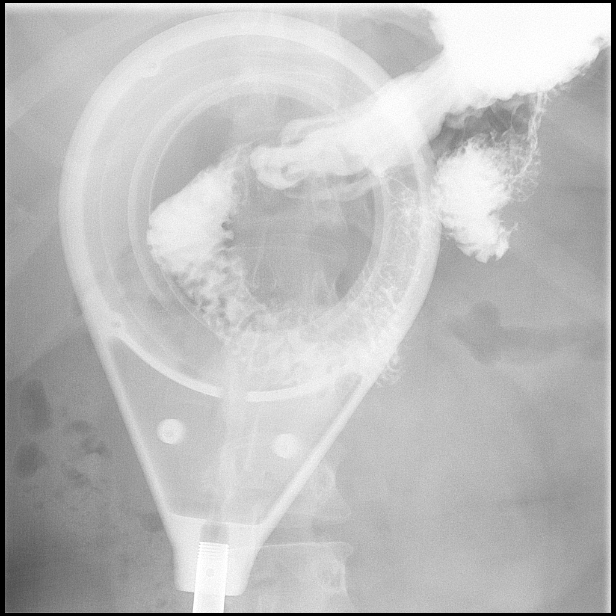

[Series 10: fluoro_barium 2fps_bw · 0.18mm/px · 2 of 2 frames shown (9 of 10)]
[frame 1/2]
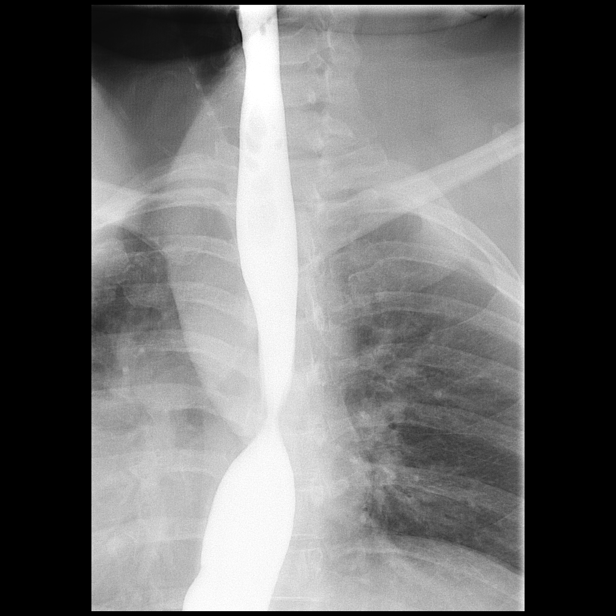
[frame 2/2]
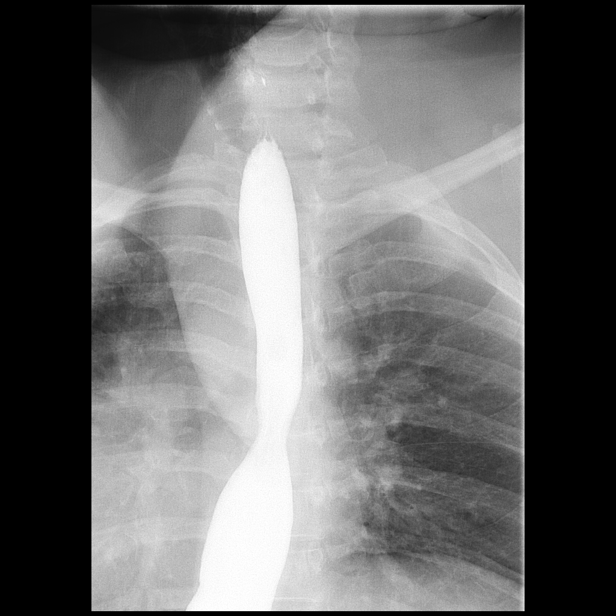

[Series 11: fluoro_barium 2fps_bw · 0.18mm/px · 2 of 2 frames shown (10 of 10)]
[frame 1/2]
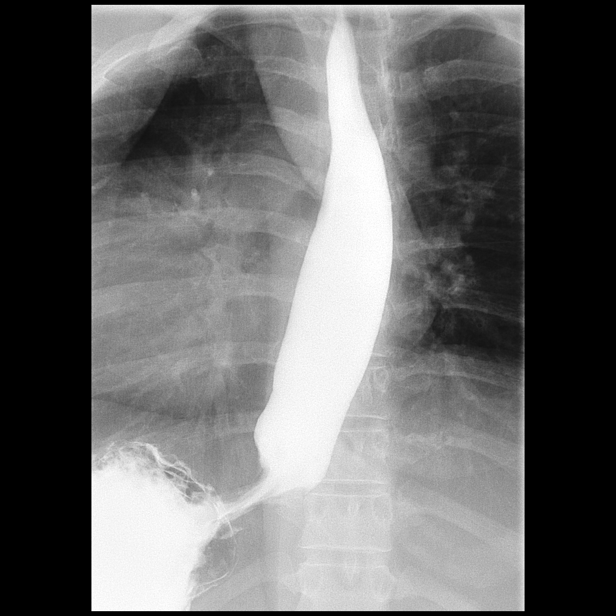
[frame 2/2]
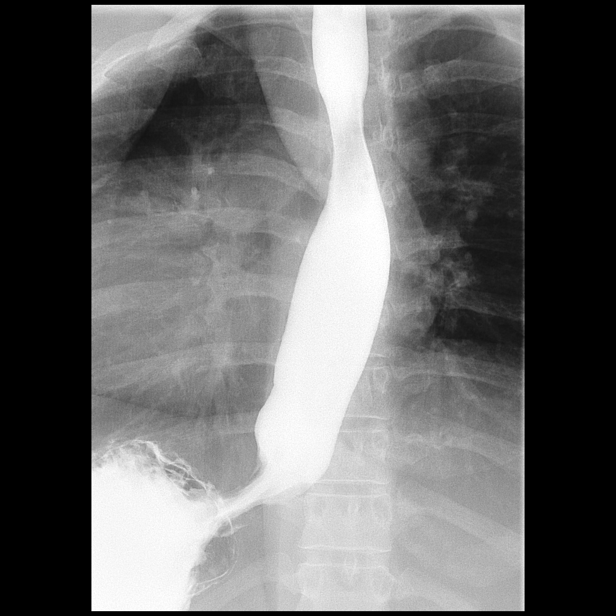

[15 of 16 positions shown; findings below may reference images not displayed]

FINDINGS: Esophagus is widely patent. No focal esophageal abnormality
identified. Peristalsis normal. Stomach and duodenum are normal.
C-loop normal. No reflux.
IMPRESSION: Normal exam.

## 2021-10-28 ENCOUNTER — Encounter: Payer: Managed Care, Other (non HMO) | Attending: Nurse Practitioner | Admitting: Skilled Nursing Facility1

## 2021-10-28 ENCOUNTER — Encounter: Payer: Self-pay | Admitting: Skilled Nursing Facility1

## 2021-10-28 DIAGNOSIS — Z6841 Body Mass Index (BMI) 40.0 and over, adult: Secondary | ICD-10-CM | POA: Insufficient documentation

## 2021-10-28 NOTE — Progress Notes (Addendum)
Pre-Operative Nutrition Class:    Patient was seen on 10/28/2021 for Pre-Operative Bariatric Surgery Education at [facility]   Due to pts last visit in May with Dietitian, Dietitian stopped pt after class to ensure she has controlled her alcohol intake and continues to work with a therapist: Pt states she may stop for the 6 months but beyond that she is an adult and will drink alcohol, pt state she limits her intake to beer and states she only has 2 on the weekends. Pt states she has not seen ehr therapist since May stating she will make another appt at some point.  Ensured she understood the education on cigarette smoking and ulceration post surgery.   Surgery date:  Surgery type: sleeve Start weight at [facility]: 253 pounds Weight today: 272 pounds  Samples given per MNT protocol. Patient educated on appropriate usage:  Bariatric Advantage Multivitamin Lot # [id number] Exp: 08/24   Bariatric Advantage Calcium  Lot # [id number] Exp: 05/15/2022   Protein Shake Lot # [id number] / [id number] Exp: 03 Nov 2021 /  03 Feb 2022  The following the learning objectives were met by the patient during this course: Identify Pre-Op Dietary Goals and will begin 2 weeks pre-operatively Identify appropriate sources of fluids and proteins  State protein recommendations and appropriate sources pre and post-operatively Identify Post-Operative Dietary Goals and will follow for 2 weeks post-operatively Identify appropriate multivitamin and calcium sources Describe the need for physical activity post-operatively and will follow MD recommendations State when to call healthcare provider regarding medication questions or post-operative complications When having a diagnosis of diabetes understanding hypoglycemia symptoms and the inclusion of 1 complex carbohydrate per meal  Handouts given during class include: Pre-Op Bariatric Surgery Diet Handout Protein Shake Handout Post-Op  Bariatric Surgery Nutrition Handout BELT Program Information Flyer Support Group Information Flyer [facility] Bariatric Supplements Price List  Follow-Up Plan: Patient will follow-up at [facility] 2 weeks post operatively for diet advancement per MD.

## 2021-11-07 ENCOUNTER — Ambulatory Visit: Payer: Self-pay | Admitting: Surgery

## 2021-11-07 NOTE — H&P (View-Only) (Signed)
Ashley Stanton [id number]   Referring Provider: Self Primary care provider Dionisio David nurse practitioner  Subjective   Chief Complaint: new weight loss   History of Present Illness: Established patient known to our practice first for management of hidradenitis (status post excision of the left axillary disease in July 2020, continues to struggle with abscesses in the left axilla as recently as this week quiring I&D in the ER) and more recently for morbid obesity. She was evaluated in December 2021 and initiated the bariatric pathway at that time. Underwent chest x-ray and upper GI which were normal, no hiatal hernia or reflux. Was cleared by psychology and dietitian. Labs were completed with an A1c of 5.7 at the time, mildly elevated triglycerides, otherwise negative. Essentially completed the entire work-up but was struggling with smoking cessation and was planning a job change with insurance changes well.  In the interim, her disease has worsened with ongoing weight gain, she has developed additional complications of her obesity including rising hemoglobin A1c, hypothyroidism, chronic kidney disease and knee pain as of most recent PCP visit which was 2/20. Has been started on Synthroid and Chantix, as well as metformin. She has been nicotine free since this past Sunday and is extremely excited, understandably, about the prospect of being able to quit smoking. 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  Last visit 01/04/20: This is a very pleasant 36 year old woman who presents for consultation regarding surgical management of severe obesity.  She has been struggling with this since she began having children.  It has become more severe in the last few years, she reports a 60 pound weight gain over the last 4 years.  She has tried numerous diets and medications including phentermine and saxenda but has a difficult  time sticking with the routine of these medications and has not been very successful with weight loss with any of these measures.  She has been considering bariatric surgery for about a year and is interested in the sleeve gastrectomy.  She does report a family history of obesity, diabetes and hypertension and is interested in avoiding developing these complications in order to maximize her quality and quantity of life.  She has a 16 year old daughter who is beginning to exhibit signs of obesity and is hoping that making this change will get her family on the right track health otherwise. 255lb/ BMI 45.2 She ran a lab for [facility].  Review of Systems: A complete review of systems was obtained from the patient. I have reviewed this information and discussed as appropriate with the patient. See HPI as well for other ROS.  Medical History: History reviewed. No pertinent past medical history.  There is no problem list on file for this patient.  Past Surgical History:  Procedure Laterality Date   2 c sections Bilateral   left axilla surgery N/A    Allergies  Allergen Reactions   Alpha 2 Adrenergic Agonist Other (See Comments)   Alpha-Gal (Galactose-Alpha-1,3-Galactose) Hives and Itching   Beef Containing Products Other (See Comments)   Citrus And Derivatives Other (See Comments)   Pineapple Swelling  Lip swelling   Pork/Porcine Containing Products Other (See Comments)   Current Outpatient Medications on File Prior to Visit  Medication Sig Dispense Refill   doxycycline (VIBRA-TABS) 100 MG tablet Take 100 mg by mouth 2 (two) times daily   metFORMIN (GLUCOPHAGE) 500 MG tablet Take 500 mg by mouth  daily with breakfast   levothyroxine (SYNTHROID) 50 MCG tablet Take 50 mcg by mouth once daily   varenicline (CHANTIX STARTING MONTH PAK) tablet as directed   No current facility-administered medications on file prior to visit.   Family History  Problem Relation Age of Onset   High blood pressure  (Hypertension) Mother   Diabetes Mother   Diabetes Father   High blood pressure (Hypertension) Brother    Social History   Tobacco Use  Smoking Status Never  Smokeless Tobacco Never    Social History   Socioeconomic History   Marital status: Single  Tobacco Use   Smoking status: Never   Smokeless tobacco: Never  Vaping Use   Vaping Use: Never used  Substance and Sexual Activity   Alcohol use: Yes   Drug use: Never   Objective:   Vitals:  04/11/21 0959  BP: (!) 178/90  Pulse: (!) 127  Temp: 36.6 C (97.8 F)  SpO2: 98%  Weight: (!) 122.8 kg (270 lb 12.8 oz)  Height: 160 cm (5\' 3" )   Body mass index is 47.97 kg/m.  Alert and well-appearing Unlabored respirations  Assessment and Plan:  Diagnoses and all orders for this visit:  Morbid obesity (CMS-HCC) Comments: BMI 30= 170lb Orders: - CBC, No Differential/Platelet - [facility] - Basic Metabolic Panel (BMP) - [id number] T4 - [facility] - Thyroid Stimulating-Hormone (TSH) - Iron and Total Iron Binding Capacity (TIBC) - Vitamin B12 - [facility] - Folate - Prothrombin Time (INR) - Vitamin D, 25-Hydroxy - [facility] - X-ray chest PA and lateral - Ambulatory Referral to Nutrition - Ambulatory Referral to Adult Behavioral Health  Hidradenitis suppurativa  Hypothyroidism, unspecified type - CBC, No Differential/Platelet - [facility] - Basic Metabolic Panel (BMP) - [id number] T4 - [facility] - Thyroid Stimulating-Hormone (TSH) - Iron and Total Iron Binding Capacity (TIBC) - Vitamin B12 - [facility] - Folate - Prothrombin Time (INR) - Vitamin D, 25-Hydroxy - [facility] - X-ray chest PA and lateral - Ambulatory Referral to Nutrition - Ambulatory Referral to Adult Behavioral Health  Stage 3a chronic kidney disease (CMS-HCC) - CBC, No Differential/Platelet - [facility] - Basic Metabolic Panel (BMP) - [id number] T4 - [facility] - Thyroid Stimulating-Hormone (TSH) - Iron and Total Iron Binding Capacity (TIBC) - Vitamin B12 - [facility] -  Folate - Prothrombin Time (INR) - Vitamin D, 25-Hydroxy - [facility] - X-ray chest PA and lateral - Ambulatory Referral to Nutrition - Ambulatory Referral to Adult Behavioral Health  Prediabetes - CBC, No Differential/Platelet - [facility] - Basic Metabolic Panel (BMP) - [id number] T4 - [facility] - Thyroid Stimulating-Hormone (TSH) - Iron and Total Iron Binding Capacity (TIBC) - Vitamin B12 - [facility] - Folate - Prothrombin Time (INR) - Vitamin D, 25-Hydroxy - [facility] - X-ray chest PA and lateral - Ambulatory Referral to Nutrition - Ambulatory Referral to Adult Behavioral Health   She remains an excellent candidate for sleeve gastrectomy. We again discussed the surgery including technical aspects, the risks of bleeding, infection, pain, scarring, injury to intra-abdominal structures, staple line leak or abscess, chronic abdominal pain or nausea, new onset or worsened GERD, DVT/PE, pneumonia, heart attack, stroke, death, failure to reach weight loss goals and weight regain, hernia. Discussed the typical pre-, peri-, and postoperative course. Discussed the importance of lifelong behavioral changes to combat the chronic and relapsing disease which is obesity. Questions welcomed and answered, will reinitiate the pathway towards sleeve gastrectomy.  She had labs last month including CMP (creatinine increased to 1.31 compared to previously less than 1), lipid panel  demonstrating elevated triglycerides and LDL worsened compared to 3 years prior, hemoglobin A1c which had gone up to 6.2, TSH/T3/T4 which were concerning for hypothyroidism with a TSH elevated at 207 (new issue). We will repeat the BMP and thyroid studies given these were newly abnormal. Her upper GI and chest x-ray were just 1 year ago, we will repeat the chest x-ray but I do not see any need to repeat the upper GI. We will need to ensure normalization of TSH and again smoking cessation prior to scheduling surgery- She will need a nicotine test  before surgery.  Joanna Borawski Raquel James, MD

## 2021-11-07 NOTE — H&P (Signed)
Ashley Stanton [id number]   Referring Provider: Self Primary care provider Dionisio David nurse practitioner  Subjective   Chief Complaint: new weight loss   History of Present Illness: Established patient known to our practice first for management of hidradenitis (status post excision of the left axillary disease in July 2020, continues to struggle with abscesses in the left axilla as recently as this week quiring I&D in the ER) and more recently for morbid obesity. She was evaluated in December 2021 and initiated the bariatric pathway at that time. Underwent chest x-ray and upper GI which were normal, no hiatal hernia or reflux. Was cleared by psychology and dietitian. Labs were completed with an A1c of 5.7 at the time, mildly elevated triglycerides, otherwise negative. Essentially completed the entire work-up but was struggling with smoking cessation and was planning a job change with insurance changes well.  In the interim, her disease has worsened with ongoing weight gain, she has developed additional complications of her obesity including rising hemoglobin A1c, hypothyroidism, chronic kidney disease and knee pain as of most recent PCP visit which was 2/20. Has been started on Synthroid and Chantix, as well as metformin. She has been nicotine free since this past Sunday and is extremely excited, understandably, about the prospect of being able to quit smoking. 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  Last visit 01/04/20: This is a very pleasant 36 year old woman who presents for consultation regarding surgical management of severe obesity.  She has been struggling with this since she began having children.  It has become more severe in the last few years, she reports a 60 pound weight gain over the last 4 years.  She has tried numerous diets and medications including phentermine and saxenda but has a difficult  time sticking with the routine of these medications and has not been very successful with weight loss with any of these measures.  She has been considering bariatric surgery for about a year and is interested in the sleeve gastrectomy.  She does report a family history of obesity, diabetes and hypertension and is interested in avoiding developing these complications in order to maximize her quality and quantity of life.  She has a 16 year old daughter who is beginning to exhibit signs of obesity and is hoping that making this change will get her family on the right track health otherwise. 255lb/ BMI 45.2 She ran a lab for [facility].  Review of Systems: A complete review of systems was obtained from the patient. I have reviewed this information and discussed as appropriate with the patient. See HPI as well for other ROS.  Medical History: History reviewed. No pertinent past medical history.  There is no problem list on file for this patient.  Past Surgical History:  Procedure Laterality Date   2 c sections Bilateral   left axilla surgery N/A    Allergies  Allergen Reactions   Alpha 2 Adrenergic Agonist Other (See Comments)   Alpha-Gal (Galactose-Alpha-1,3-Galactose) Hives and Itching   Beef Containing Products Other (See Comments)   Citrus And Derivatives Other (See Comments)   Pineapple Swelling  Lip swelling   Pork/Porcine Containing Products Other (See Comments)   Current Outpatient Medications on File Prior to Visit  Medication Sig Dispense Refill   doxycycline (VIBRA-TABS) 100 MG tablet Take 100 mg by mouth 2 (two) times daily   metFORMIN (GLUCOPHAGE) 500 MG tablet Take 500 mg by mouth  daily with breakfast   levothyroxine (SYNTHROID) 50 MCG tablet Take 50 mcg by mouth once daily   varenicline (CHANTIX STARTING MONTH PAK) tablet as directed   No current facility-administered medications on file prior to visit.   Family History  Problem Relation Age of Onset   High blood pressure  (Hypertension) Mother   Diabetes Mother   Diabetes Father   High blood pressure (Hypertension) Brother    Social History   Tobacco Use  Smoking Status Never  Smokeless Tobacco Never    Social History   Socioeconomic History   Marital status: Single  Tobacco Use   Smoking status: Never   Smokeless tobacco: Never  Vaping Use   Vaping Use: Never used  Substance and Sexual Activity   Alcohol use: Yes   Drug use: Never   Objective:   Vitals:  04/11/21 0959  BP: (!) 178/90  Pulse: (!) 127  Temp: 36.6 C (97.8 F)  SpO2: 98%  Weight: (!) 122.8 kg (270 lb 12.8 oz)  Height: 160 cm (5\' 3" )   Body mass index is 47.97 kg/m.  Alert and well-appearing Unlabored respirations  Assessment and Plan:  Diagnoses and all orders for this visit:  Morbid obesity (CMS-HCC) Comments: BMI 30= 170lb Orders: - CBC, No Differential/Platelet - [facility] - Basic Metabolic Panel (BMP) - [id number] T4 - [facility] - Thyroid Stimulating-Hormone (TSH) - Iron and Total Iron Binding Capacity (TIBC) - Vitamin B12 - [facility] - Folate - Prothrombin Time (INR) - Vitamin D, 25-Hydroxy - [facility] - X-ray chest PA and lateral - Ambulatory Referral to Nutrition - Ambulatory Referral to Adult Behavioral Health  Hidradenitis suppurativa  Hypothyroidism, unspecified type - CBC, No Differential/Platelet - [facility] - Basic Metabolic Panel (BMP) - [id number] T4 - [facility] - Thyroid Stimulating-Hormone (TSH) - Iron and Total Iron Binding Capacity (TIBC) - Vitamin B12 - [facility] - Folate - Prothrombin Time (INR) - Vitamin D, 25-Hydroxy - [facility] - X-ray chest PA and lateral - Ambulatory Referral to Nutrition - Ambulatory Referral to Adult Behavioral Health  Stage 3a chronic kidney disease (CMS-HCC) - CBC, No Differential/Platelet - [facility] - Basic Metabolic Panel (BMP) - [id number] T4 - [facility] - Thyroid Stimulating-Hormone (TSH) - Iron and Total Iron Binding Capacity (TIBC) - Vitamin B12 - [facility] -  Folate - Prothrombin Time (INR) - Vitamin D, 25-Hydroxy - [facility] - X-ray chest PA and lateral - Ambulatory Referral to Nutrition - Ambulatory Referral to Adult Behavioral Health  Prediabetes - CBC, No Differential/Platelet - [facility] - Basic Metabolic Panel (BMP) - [id number] T4 - [facility] - Thyroid Stimulating-Hormone (TSH) - Iron and Total Iron Binding Capacity (TIBC) - Vitamin B12 - [facility] - Folate - Prothrombin Time (INR) - Vitamin D, 25-Hydroxy - [facility] - X-ray chest PA and lateral - Ambulatory Referral to Nutrition - Ambulatory Referral to Adult Behavioral Health   She remains an excellent candidate for sleeve gastrectomy. We again discussed the surgery including technical aspects, the risks of bleeding, infection, pain, scarring, injury to intra-abdominal structures, staple line leak or abscess, chronic abdominal pain or nausea, new onset or worsened GERD, DVT/PE, pneumonia, heart attack, stroke, death, failure to reach weight loss goals and weight regain, hernia. Discussed the typical pre-, peri-, and postoperative course. Discussed the importance of lifelong behavioral changes to combat the chronic and relapsing disease which is obesity. Questions welcomed and answered, will reinitiate the pathway towards sleeve gastrectomy.  She had labs last month including CMP (creatinine increased to 1.31 compared to previously less than 1), lipid panel  demonstrating elevated triglycerides and LDL worsened compared to 3 years prior, hemoglobin A1c which had gone up to 6.2, TSH/T3/T4 which were concerning for hypothyroidism with a TSH elevated at 207 (new issue). We will repeat the BMP and thyroid studies given these were newly abnormal. Her upper GI and chest x-ray were just 1 year ago, we will repeat the chest x-ray but I do not see any need to repeat the upper GI. We will need to ensure normalization of TSH and again smoking cessation prior to scheduling surgery- She will need a nicotine test  before surgery.  Jordain Radin Nusz Raquel James, MD

## 2021-11-19 NOTE — Patient Instructions (Signed)
SURGICAL WAITING ROOM VISITATION Patients having surgery or a procedure may have no more than 2 support people in the waiting area - these visitors may rotate.   Children under the age of 42 must have an adult with them who is not the patient. If the patient needs to stay at the hospital during part of their recovery, the visitor guidelines for inpatient rooms apply. Pre-op nurse will coordinate an appropriate time for 1 support person to accompany patient in pre-op.  This support person may not rotate.    Please refer to the [facility] website for the visitor guidelines for Inpatients (after your surgery is over and you are in a regular room).     Your procedure is scheduled on: 11/26/21   Report to [facility] Main Entrance    Report to admitting at 7:45 AM   Call this number if you have problems the morning of surgery [phone]   MORNING OF SURGERY DRINK:   DRINK 1 G2 drink BEFORE YOU LEAVE HOME, DRINK ALL OF THE  G2 DRINK AT ONE TIME.   NO SOLID FOOD AFTER 600 PM THE NIGHT BEFORE YOUR SURGERY. YOU MAY DRINK CLEAR FLUIDS. THE G2 DRINK YOU DRINK BEFORE YOU LEAVE HOME WILL BE THE LAST FLUIDS YOU DRINK BEFORE SURGERY.  PAIN IS EXPECTED AFTER SURGERY AND WILL NOT BE COMPLETELY ELIMINATED. AMBULATION AND TYLENOL WILL HELP REDUCE INCISIONAL AND GAS PAIN. MOVEMENT IS KEY!  YOU ARE EXPECTED TO BE OUT OF BED WITHIN 4 HOURS OF ADMISSION TO YOUR PATIENT ROOM.  SITTING IN THE RECLINER THROUGHOUT THE DAY IS IMPORTANT FOR DRINKING FLUIDS AND MOVING GAS THROUGHOUT THE GI TRACT.  COMPRESSION STOCKINGS SHOULD BE WORN [place] UNLESS YOU ARE WALKING.   INCENTIVE SPIROMETER SHOULD BE USED EVERY HOUR WHILE AWAKE TO DECREASE POST-OPERATIVE COMPLICATIONS SUCH AS PNEUMONIA.  WHEN DISCHARGED HOME, IT IS IMPORTANT TO CONTINUE TO WALK EVERY HOUR AND USE THE INCENTIVE SPIROMETER EVERY HOUR.    You may have the following liquids until 7:00 AM DAY OF SURGERY  Water Non-Citrus  Juices (without pulp, NO RED) Carbonated Beverages Black Coffee (NO MILK/CREAM OR CREAMERS, sugar ok)  Clear Tea (NO MILK/CREAM OR CREAMERS, sugar ok) regular and decaf                             Plain Jell-O (NO RED)                                           Fruit ices (not with fruit pulp, NO RED)                                     Popsicles (NO RED)                                                               Sports drinks like Gatorade (NO RED)                 The day of surgery:  Drink ONE (1) Pre-Surgery G2 at 7:00 AM  the morning of surgery. Drink in one sitting. Do not sip.  This drink was given to you during your hospital  pre-op appointment visit. Nothing else to drink after completing the  Pre-Surgery G2.          If you have questions, please contact your surgeon's office.   FOLLOW BOWEL PREP AND ANY ADDITIONAL PRE OP INSTRUCTIONS YOU RECEIVED FROM YOUR SURGEON'S OFFICE!!!     Oral Hygiene is also important to reduce your risk of infection.                                    Remember - BRUSH YOUR TEETH THE MORNING OF SURGERY WITH YOUR REGULAR TOOTHPASTE   Take these medicines the morning of surgery with A SIP OF WATER: Zyrtec  DO NOT TAKE ANY ORAL DIABETIC MEDICATIONS DAY OF YOUR SURGERY  How to Manage Your Diabetes Before and After Surgery  Why is it important to control my blood sugar before and after surgery? Improving blood sugar levels before and after surgery helps healing and can limit problems. A way of improving blood sugar control is eating a healthy diet by:  Eating less sugar and carbohydrates  Increasing activity/exercise  Talking with your doctor about reaching your blood sugar goals High blood sugars (greater than 180 mg/dL) can raise your risk of infections and slow your recovery, so you will need to focus on controlling your diabetes during the weeks before surgery. Make sure that the doctor who takes care of your diabetes knows about your  planned surgery including the date and location.  How do I manage my blood sugar before surgery? Check your blood sugar at least 4 times a day, starting 2 days before surgery, to make sure that the level is not too high or low. Check your blood sugar the morning of your surgery when you wake up and every 2 hours until you get to the Short Stay unit. If your blood sugar is less than 70 mg/dL, you will need to treat for low blood sugar: Do not take insulin. Treat a low blood sugar (less than 70 mg/dL) with  cup of clear juice (cranberry or apple), 4 glucose tablets, OR glucose gel. Recheck blood sugar in 15 minutes after treatment (to make sure it is greater than 70 mg/dL). If your blood sugar is not greater than 70 mg/dL on recheck, call [phone] for further instructions. Report your blood sugar to the short stay nurse when you get to Short Stay.  If you are admitted to the hospital after surgery: Your blood sugar will be checked by the staff and you will probably be given insulin after surgery (instead of oral diabetes medicines) to make sure you have good blood sugar levels. The goal for blood sugar control after surgery is 80-180 mg/dL.   WHAT DO I DO ABOUT MY DIABETES MEDICATION?  Do not take oral diabetes medicines (pills) the morning of surgery.  THE DAY BEFORE SURGERY, take Metformin as prescribed.     THE MORNING OF SURGERY, do not take Metformin.  Reviewed and Endorsed by [facility] Patient Education Committee, August 2015                               You may not have any metal on your body including hair pins, jewelry, and body piercing  Do not wear make-up, lotions, powders, perfumes, or deodorant  Do not wear nail polish including gel and S&S, artificial/acrylic nails, or any other type of covering on natural nails including finger and toenails. If you have artificial nails, gel coating, etc. that needs to be removed by a nail salon please have this removed  prior to surgery or surgery may need to be canceled/ delayed if the surgeon/ anesthesia feels like they are unable to be safely monitored.   Do not shave  48 hours prior to surgery.    Do not bring valuables to the hospital. [facility] IS NOT             RESPONSIBLE   FOR VALUABLES.   Bring small overnight bag day of surgery.   DO NOT BRING YOUR HOME MEDICATIONS TO THE HOSPITAL. PHARMACY WILL DISPENSE MEDICATIONS LISTED ON YOUR MEDICATION LIST TO YOU DURING YOUR ADMISSION IN THE HOSPITAL!               Please read over the following fact sheets you were given: IF YOU HAVE QUESTIONS ABOUT YOUR PRE-OP INSTRUCTIONS PLEASE CALL [phone] [place]   If you received a COVID test during your pre-op visit  it is requested that you wear a mask when out in public, stay away from anyone that may not be feeling well and notify your surgeon if you develop symptoms. If you test positive for Covid or have been in contact with anyone that has tested positive in the last 10 days please notify you surgeon.     [facility] - Preparing for Surgery Before surgery, you can play an important role.  Because skin is not sterile, your skin needs to be as free of germs as possible.  You can reduce the number of germs on your skin by washing with CHG (chlorahexidine gluconate) soap before surgery.  CHG is an antiseptic cleaner which kills germs and bonds with the skin to continue killing germs even after washing. Please DO NOT use if you have an allergy to CHG or antibacterial soaps.  If your skin becomes reddened/irritated stop using the CHG and inform your nurse when you arrive at Short Stay. Do not shave (including legs and underarms) for at least 48 hours prior to the first CHG shower.  You may shave your face/neck.  Please follow these instructions carefully:  1.  Shower with CHG Soap the night before surgery and the  morning of surgery.  2.  If you choose to wash your hair, wash your hair first as usual with  your normal  shampoo.  3.  After you shampoo, rinse your hair and body thoroughly to remove the shampoo.                             4.  Use CHG as you would any other liquid soap.  You can apply chg directly to the skin and wash.  Gently with a scrungie or clean washcloth.  5.  Apply the CHG Soap to your body ONLY FROM THE NECK DOWN.   Do   not use on face/ open                           Wound or open sores. Avoid contact with eyes, ears mouth and   genitals (private parts).  Wash face,  Genitals (private parts) with your normal soap.             6.  Wash thoroughly, paying special attention to the area where your    surgery  will be performed.  7.  Thoroughly rinse your body with warm water from the neck down.  8.  DO NOT shower/wash with your normal soap after using and rinsing off the CHG Soap.                9.  Pat yourself dry with a clean towel.            10.  Wear clean pajamas.            11.  Place clean sheets on your bed the night of your first shower and do not  sleep with pets. Day of Surgery : Do not apply any lotions/deodorants the morning of surgery.  Please wear clean clothes to the hospital/surgery center.  FAILURE TO FOLLOW THESE INSTRUCTIONS MAY RESULT IN THE CANCELLATION OF YOUR SURGERY  PATIENT SIGNATURE_________________________________  NURSE SIGNATURE__________________________________  ________________________________________________________________________   Ashley Stanton  An incentive spirometer is a tool that can help keep your lungs clear and active. This tool measures how well you are filling your lungs with each breath. Taking [place] deep breaths may help reverse or decrease the chance of developing breathing (pulmonary) problems (especially infection) following: A [place] period of time when you are unable to move or be active. BEFORE THE PROCEDURE  If the spirometer includes an indicator to show your best effort, your nurse or  respiratory therapist will set it to a desired goal. If possible, sit up straight or lean slightly forward. Try not to slouch. Hold the incentive spirometer in an upright position. INSTRUCTIONS FOR USE  Sit on the edge of your bed if possible, or sit up as far as you can in bed or on a chair. Hold the incentive spirometer in an upright position. Breathe out normally. Place the mouthpiece in your mouth and seal your lips tightly around it. Breathe in slowly and as deeply as possible, raising the piston or the ball toward the top of the column. Hold your breath for 3-5 seconds or for as [place] as possible. Allow the piston or ball to fall to the bottom of the column. Remove the mouthpiece from your mouth and breathe out normally. Rest for a few seconds and repeat Steps 1 through 7 at least 10 times every 1-2 hours when you are awake. Take your time and take a few normal breaths between deep breaths. The spirometer may include an indicator to show your best effort. Use the indicator as a goal to work toward during each repetition. After each set of 10 deep breaths, practice coughing to be sure your lungs are clear. If you have an incision (the cut made at the time of surgery), support your incision when coughing by placing a pillow or rolled up towels firmly against it. Once you are able to get out of bed, walk around indoors and cough well. You may stop using the incentive spirometer when instructed by your caregiver.  RISKS AND COMPLICATIONS Take your time so you do not get dizzy or light-headed. If you are in pain, you may need to take or ask for pain medication before doing incentive spirometry. It is harder to take a deep breath if you are having pain. AFTER USE Rest and breathe slowly and easily. It can be helpful to  keep track of a log of your progress. Your caregiver can provide you with a simple table to help with this. If you are using the spirometer at home, follow these  instructions: SEEK MEDICAL CARE IF:  You are having difficultly using the spirometer. You have trouble using the spirometer as often as instructed. Your pain medication is not giving enough relief while using the spirometer. You develop fever of 100.5 F (38.1 C) or higher. SEEK IMMEDIATE MEDICAL CARE IF:  You cough up bloody sputum that had not been present before. You develop fever of 102 F (38.9 C) or greater. You develop worsening pain at or near the incision site. MAKE SURE YOU:  Understand these instructions. Will watch your condition. Will get help right away if you are not doing well or get worse. Document Released: 06/02/2006 Document Revised: 04/14/2011 Document Reviewed: 08/03/2006 ExitCare Patient Information 2014 [employer], [place].   ________________________________________________________________________  WHAT IS A BLOOD TRANSFUSION? Blood Transfusion Information  A transfusion is the replacement of blood or some of its parts. Blood is made up of multiple cells which provide different functions. Red blood cells carry oxygen and are used for blood loss replacement. White blood cells fight against infection. Platelets control bleeding. Plasma helps clot blood. Other blood products are available for specialized needs, such as hemophilia or other clotting disorders. BEFORE THE TRANSFUSION  Who gives blood for transfusions?  Healthy volunteers who are fully evaluated to make sure their blood is safe. This is blood bank blood. Transfusion therapy is the safest it has ever been in the practice of medicine. Before blood is taken from a donor, a complete history is taken to make sure that person has no history of diseases nor engages in risky social behavior (examples are intravenous drug use or sexual activity with multiple partners). The donor's travel history is screened to minimize risk of transmitting infections, such as malaria. The donated blood is tested for signs of  infectious diseases, such as HIV and hepatitis. The blood is then tested to be sure it is compatible with you in order to minimize the chance of a transfusion reaction. If you or a relative donates blood, this is often done in anticipation of surgery and is not appropriate for emergency situations. It takes many days to process the donated blood. RISKS AND COMPLICATIONS Although transfusion therapy is very safe and saves many lives, the main dangers of transfusion include:  Getting an infectious disease. Developing a transfusion reaction. This is an allergic reaction to something in the blood you were given. Every precaution is taken to prevent this. The decision to have a blood transfusion has been considered carefully by your caregiver before blood is given. Blood is not given unless the benefits outweigh the risks. AFTER THE TRANSFUSION Right after receiving a blood transfusion, you will usually feel much better and more energetic. This is especially true if your red blood cells have gotten low (anemic). The transfusion raises the level of the red blood cells which carry oxygen, and this usually causes an energy increase. The nurse administering the transfusion will monitor you carefully for complications. HOME CARE INSTRUCTIONS  No special instructions are needed after a transfusion. You may find your energy is better. Speak with your caregiver about any limitations on activity for underlying diseases you may have. SEEK MEDICAL CARE IF:  Your condition is not improving after your transfusion. You develop redness or irritation at the intravenous (IV) site. SEEK IMMEDIATE MEDICAL CARE IF:  Any of the  following symptoms occur over the next 12 hours: Shaking chills. You have a temperature by mouth above 102 F (38.9 C), not controlled by medicine. Chest, back, or muscle pain. People around you feel you are not acting correctly or are confused. Shortness of breath or difficulty  breathing. Dizziness and fainting. You get a rash or develop hives. You have a decrease in urine output. Your urine turns a dark color or changes to pink, red, or brown. Any of the following symptoms occur over the next 10 days: You have a temperature by mouth above 102 F (38.9 C), not controlled by medicine. Shortness of breath. Weakness after normal activity. The white part of the eye turns yellow (jaundice). You have a decrease in the amount of urine or are urinating less often. Your urine turns a dark color or changes to pink, red, or brown. Document Released: 01/18/2000 Document Revised: 04/14/2011 Document Reviewed: 09/06/2007 [facility] Patient Information 2014 [place], [place].  _______________________________________________________________________

## 2021-11-19 NOTE — Progress Notes (Signed)
COVID Vaccine Completed: yes  Date of COVID positive in last 90 days:  PCP - Dionisio David, NP Cardiologist -   Chest x-ray - 05/27/21 Epic EKG -  Stress Test -  ECHO -  Cardiac Cath -  Pacemaker/ICD device last checked: Spinal Cord Stimulator:  Bowel Prep -   Sleep Study -  CPAP -   Fasting Blood Sugar - pre DM Checks Blood Sugar _____ times a day  Blood Thinner Instructions: Aspirin Instructions: Last Dose:  Activity level:  Can go up a flight of stairs and perform activities of daily living without stopping and without symptoms of chest pain or shortness of breath.  Able to exercise without symptoms  Unable to go up a flight of stairs without symptoms of     Anesthesia review:   Patient denies shortness of breath, fever, cough and chest pain at PAT appointment  Patient verbalized understanding of instructions that were given to them at the PAT appointment. Patient was also instructed that they will need to review over the PAT instructions again at home before surgery.

## 2021-11-20 ENCOUNTER — Encounter (HOSPITAL_COMMUNITY): Payer: Self-pay

## 2021-11-20 ENCOUNTER — Encounter (HOSPITAL_COMMUNITY)
Admission: RE | Admit: 2021-11-20 | Discharge: 2021-11-20 | Disposition: A | Discharge status: Home / Self Care | Payer: Managed Care, Other (non HMO) | Source: Ambulatory Visit | Attending: Surgery | Admitting: Surgery

## 2021-11-20 VITALS — BP 128/84 | HR 96 | Temp 98.6°F | Resp 14 | Ht 63.0 in | Wt 262.0 lb

## 2021-11-20 DIAGNOSIS — Z01818 Encounter for other preprocedural examination: Secondary | ICD-10-CM | POA: Diagnosis present

## 2021-11-20 DIAGNOSIS — R7303 Prediabetes: Secondary | ICD-10-CM

## 2021-11-20 HISTORY — DX: Prediabetes: R73.03

## 2021-11-20 LAB — CBC WITH DIFFERENTIAL/PLATELET
Abs Immature Granulocytes: 0.02 10*3/uL (ref 0.00–0.07)
Basophils Absolute: 0 10*3/uL (ref 0.0–0.1)
Basophils Relative: 0 %
Eosinophils Absolute: 0.1 10*3/uL (ref 0.0–0.5)
Eosinophils Relative: 2 %
HCT: 40.5 % (ref 36.0–46.0)
Hemoglobin: 12.7 g/dL (ref 12.0–15.0)
Immature Granulocytes: 0 %
Lymphocytes Relative: 38 %
Lymphs Abs: 2.6 10*3/uL (ref 0.7–4.0)
MCH: 28.2 pg (ref 26.0–34.0)
MCHC: 31.4 g/dL (ref 30.0–36.0)
MCV: 90 fL (ref 80.0–100.0)
Monocytes Absolute: 0.5 10*3/uL (ref 0.1–1.0)
Monocytes Relative: 7 %
Neutro Abs: 3.6 10*3/uL (ref 1.7–7.7)
Neutrophils Relative %: 53 %
Platelets: 287 10*3/uL (ref 150–400)
RBC: 4.5 MIL/uL (ref 3.87–5.11)
RDW: 12.8 % (ref 11.5–15.5)
WBC: 6.8 10*3/uL (ref 4.0–10.5)
nRBC: 0 % (ref 0.0–0.2)

## 2021-11-20 LAB — COMPREHENSIVE METABOLIC PANEL
ALT: 24 U/L (ref 0–44)
AST: 24 U/L (ref 15–41)
Albumin: 3.4 g/dL — ABNORMAL LOW (ref 3.5–5.0)
Alkaline Phosphatase: 50 U/L (ref 38–126)
Anion gap: 8 (ref 5–15)
BUN: 11 mg/dL (ref 6–20)
CO2: 22 mmol/L (ref 22–32)
Calcium: 9.2 mg/dL (ref 8.9–10.3)
Chloride: 108 mmol/L (ref 98–111)
Creatinine, Ser: 0.88 mg/dL (ref 0.44–1.00)
GFR, Estimated: >60 mL/min (ref >60)
Glucose, Bld: 111 mg/dL — ABNORMAL HIGH (ref 70–99)
Potassium: 4.2 mmol/L (ref 3.5–5.1)
Sodium: 138 mmol/L (ref 135–145)
Total Bilirubin: 0.3 mg/dL (ref 0.3–1.2)
Total Protein: 7.3 g/dL (ref 6.5–8.1)

## 2021-11-20 LAB — GLUCOSE, CAPILLARY: Glucose-Capillary: 129 mg/dL — ABNORMAL HIGH (ref 70–99)

## 2021-11-20 LAB — HEMOGLOBIN A1C
Hgb A1c MFr Bld: 5.9 % — ABNORMAL HIGH (ref 4.8–5.6)
Mean Plasma Glucose: 122.63 mg/dL

## 2021-11-25 ENCOUNTER — Encounter (HOSPITAL_COMMUNITY): Payer: Self-pay | Admitting: Surgery

## 2021-11-25 NOTE — Anesthesia Preprocedure Evaluation (Addendum)
Anesthesia Evaluation  Patient identified by MRN, date of birth, ID band Patient awake    Reviewed: Allergy & Precautions, NPO status , Patient's Chart, lab work & pertinent test results  Airway Mallampati: II  TM Distance: >3 FB Neck ROM: Full    Dental no notable dental hx. (+) Teeth Intact, Dental Advisory Given   Pulmonary Patient abstained from smoking., former smoker,    Pulmonary exam normal breath sounds clear to auscultation       Cardiovascular negative cardio ROS Normal cardiovascular exam Rhythm:Regular Rate:Normal     Neuro/Psych negative neurological ROS  negative psych ROS   GI/Hepatic Neg liver ROS,   Endo/Other  Morbid obesity (BMI 46.4)  Renal/GU Lab Results      Component                Value               Date                      CREATININE               0.88                11/20/2021                K                        4.2                 11/20/2021                   Musculoskeletal negative musculoskeletal ROS (+)   Abdominal   Peds  Hematology Lab Results      Component                Value               Date                           HGB                      12.7                11/20/2021                HCT                      40.5                11/20/2021                    PLT                      287                 11/20/2021              Anesthesia Other Findings Alpha GAl  Reproductive/Obstetrics negative OB ROS                            Anesthesia Physical Anesthesia Plan  ASA: 3  Anesthesia Plan: General   Post-op Pain Management: Ketamine IV*, Lidocaine infusion* and Tylenol PO (pre-op)*   Induction: Intravenous  PONV Risk Score  and Plan: 4 or greater and Treatment may vary due to age or medical condition, Midazolam, Ondansetron and Dexamethasone  Airway Management Planned: Oral ETT  Additional Equipment: None  Intra-op Plan:    Post-operative Plan: Extubation in OR  Informed Consent: I have reviewed the patients History and Physical, chart, labs and discussed the procedure including the risks, benefits and alternatives for the proposed anesthesia with the patient or authorized representative who has indicated his/her understanding and acceptance.     Dental advisory given  Plan Discussed with: CRNA and Anesthesiologist  Anesthesia Plan Comments:        Anesthesia Quick Evaluation

## 2021-11-26 ENCOUNTER — Encounter (HOSPITAL_COMMUNITY): Payer: Self-pay | Admitting: Surgery

## 2021-11-26 ENCOUNTER — Encounter (HOSPITAL_COMMUNITY)
Admission: RE | Disposition: A | Discharge status: Home / Self Care | Payer: Self-pay | Source: Ambulatory Visit | Attending: Surgery

## 2021-11-26 ENCOUNTER — Inpatient Hospital Stay (HOSPITAL_COMMUNITY): Payer: Managed Care, Other (non HMO) | Admitting: Anesthesiology

## 2021-11-26 ENCOUNTER — Inpatient Hospital Stay (HOSPITAL_COMMUNITY)
Admission: RE | Admit: 2021-11-26 | Discharge: 2021-11-28 | DRG: 621 | Disposition: A | Discharge status: Home / Self Care | Payer: Managed Care, Other (non HMO) | Source: Ambulatory Visit | Attending: Surgery | Admitting: Surgery

## 2021-11-26 ENCOUNTER — Other Ambulatory Visit: Payer: Self-pay

## 2021-11-26 DIAGNOSIS — L732 Hidradenitis suppurativa: Secondary | ICD-10-CM | POA: Diagnosis present

## 2021-11-26 DIAGNOSIS — Z87891 Personal history of nicotine dependence: Secondary | ICD-10-CM | POA: Diagnosis not present

## 2021-11-26 DIAGNOSIS — Z79899 Other long term (current) drug therapy: Secondary | ICD-10-CM | POA: Diagnosis not present

## 2021-11-26 DIAGNOSIS — Z793 Long term (current) use of hormonal contraceptives: Secondary | ICD-10-CM

## 2021-11-26 DIAGNOSIS — Z7984 Long term (current) use of oral hypoglycemic drugs: Secondary | ICD-10-CM | POA: Diagnosis not present

## 2021-11-26 DIAGNOSIS — Z888 Allergy status to other drugs, medicaments and biological substances status: Secondary | ICD-10-CM

## 2021-11-26 DIAGNOSIS — Z6841 Body Mass Index (BMI) 40.0 and over, adult: Secondary | ICD-10-CM

## 2021-11-26 DIAGNOSIS — Z79891 Long term (current) use of opiate analgesic: Secondary | ICD-10-CM

## 2021-11-26 DIAGNOSIS — Z8249 Family history of ischemic heart disease and other diseases of the circulatory system: Secondary | ICD-10-CM | POA: Diagnosis not present

## 2021-11-26 DIAGNOSIS — Z91018 Allergy to other foods: Secondary | ICD-10-CM

## 2021-11-26 DIAGNOSIS — Z79890 Hormone replacement therapy (postmenopausal): Secondary | ICD-10-CM | POA: Diagnosis not present

## 2021-11-26 DIAGNOSIS — E039 Hypothyroidism, unspecified: Secondary | ICD-10-CM | POA: Diagnosis present

## 2021-11-26 DIAGNOSIS — R7303 Prediabetes: Secondary | ICD-10-CM

## 2021-11-26 HISTORY — PX: LAPAROSCOPIC GASTRIC SLEEVE RESECTION: SHX5895

## 2021-11-26 HISTORY — PX: UPPER GI ENDOSCOPY: SHX6162

## 2021-11-26 LAB — TYPE AND SCREEN
ABO/RH(D): O NEG
Antibody Screen: NEGATIVE

## 2021-11-26 LAB — GLUCOSE, CAPILLARY: Glucose-Capillary: 117 mg/dL — ABNORMAL HIGH (ref 70–99)

## 2021-11-26 LAB — POCT PREGNANCY, URINE: Preg Test, Ur: NEGATIVE

## 2021-11-26 SURGERY — GASTRECTOMY, SLEEVE, LAPAROSCOPIC
Anesthesia: General | Site: Esophagus

## 2021-11-26 MED ORDER — CHLORHEXIDINE GLUCONATE 4 % EX LIQD: 60.0000 mL | Freq: Once | CUTANEOUS | Status: DC

## 2021-11-26 MED ORDER — METHOCARBAMOL 500 MG IVPB - SIMPLE MED: 500.0000 mg | Freq: Four times a day (QID) | INTRAVENOUS | Status: DC | PRN

## 2021-11-26 MED ORDER — KETAMINE HCL 10 MG/ML IJ SOLN
INTRAMUSCULAR | Status: DC | PRN
  Administered 2021-11-26: 30 mg via INTRAVENOUS
  Administered 2021-11-26: 10 mg via INTRAVENOUS

## 2021-11-26 MED ORDER — ONDANSETRON HCL 4 MG/2ML IJ SOLN
INTRAMUSCULAR | Status: DC | PRN
  Administered 2021-11-26: 4 mg via INTRAVENOUS

## 2021-11-26 MED ORDER — HYDROMORPHONE HCL 1 MG/ML IJ SOLN: 0.5000 mg | INTRAMUSCULAR | Status: DC | PRN

## 2021-11-26 MED ORDER — SIMETHICONE 80 MG PO CHEW
80.0000 mg | CHEWABLE_TABLET | Freq: Four times a day (QID) | ORAL | Status: DC | PRN
  Administered 2021-11-26 – 2021-11-27 (×4): 80 mg via ORAL
  Filled 2021-11-26 (×4): qty 1

## 2021-11-26 MED ORDER — GABAPENTIN 100 MG PO CAPS
200.0000 mg | ORAL_CAPSULE | Freq: Two times a day (BID) | ORAL | Status: DC
  Administered 2021-11-26 – 2021-11-27 (×2): 200 mg via ORAL
  Filled 2021-11-26 (×2): qty 2

## 2021-11-26 MED ORDER — PANTOPRAZOLE SODIUM 40 MG IV SOLR
40.0000 mg | Freq: Every day | INTRAVENOUS | Status: DC
  Administered 2021-11-26 – 2021-11-27 (×2): 40 mg via INTRAVENOUS
  Filled 2021-11-26 (×2): qty 10

## 2021-11-26 MED ORDER — SUGAMMADEX SODIUM 500 MG/5ML IV SOLN
INTRAVENOUS | Status: AC
  Filled 2021-11-26: qty 5

## 2021-11-26 MED ORDER — LACTATED RINGERS IV SOLN: INTRAVENOUS | Status: DC

## 2021-11-26 MED ORDER — BUPIVACAINE-EPINEPHRINE 0.25% -1:200000 IJ SOLN
INTRAMUSCULAR | Status: DC | PRN
  Administered 2021-11-26: 30 mL

## 2021-11-26 MED ORDER — PHENYLEPHRINE 80 MCG/ML (10ML) SYRINGE FOR IV PUSH (FOR BLOOD PRESSURE SUPPORT)
PREFILLED_SYRINGE | INTRAVENOUS | Status: AC
  Filled 2021-11-26: qty 10

## 2021-11-26 MED ORDER — KETOROLAC TROMETHAMINE 15 MG/ML IJ SOLN: 15.0000 mg | Freq: Three times a day (TID) | INTRAMUSCULAR | Status: DC | PRN

## 2021-11-26 MED ORDER — FENTANYL CITRATE PF 50 MCG/ML IJ SOSY
25.0000 ug | PREFILLED_SYRINGE | INTRAMUSCULAR | Status: DC | PRN
  Administered 2021-11-26: 50 ug via INTRAVENOUS

## 2021-11-26 MED ORDER — FENTANYL CITRATE PF 50 MCG/ML IJ SOSY
PREFILLED_SYRINGE | INTRAMUSCULAR | Status: AC
  Administered 2021-11-26: 50 ug via INTRAVENOUS
  Filled 2021-11-26: qty 2

## 2021-11-26 MED ORDER — CHLORHEXIDINE GLUCONATE 0.12 % MT SOLN
15.0000 mL | Freq: Once | OROMUCOSAL | Status: AC
  Administered 2021-11-26: 15 mL via OROMUCOSAL

## 2021-11-26 MED ORDER — MIDAZOLAM HCL 5 MG/5ML IJ SOLN
INTRAMUSCULAR | Status: DC | PRN
  Administered 2021-11-26: 2 mg via INTRAVENOUS

## 2021-11-26 MED ORDER — 0.9 % SODIUM CHLORIDE (POUR BTL) OPTIME
TOPICAL | Status: DC | PRN
  Administered 2021-11-26: 1000 mL

## 2021-11-26 MED ORDER — ROCURONIUM BROMIDE 10 MG/ML (PF) SYRINGE
PREFILLED_SYRINGE | INTRAVENOUS | Status: DC | PRN
  Administered 2021-11-26: 80 mg via INTRAVENOUS

## 2021-11-26 MED ORDER — GABAPENTIN 300 MG PO CAPS
300.0000 mg | ORAL_CAPSULE | ORAL | Status: AC
  Administered 2021-11-26: 300 mg via ORAL
  Filled 2021-11-26: qty 1

## 2021-11-26 MED ORDER — ENSURE MAX PROTEIN PO LIQD
2.0000 [oz_av] | ORAL | Status: DC
  Administered 2021-11-27 – 2021-11-28 (×8): 2 [oz_av] via ORAL

## 2021-11-26 MED ORDER — DEXAMETHASONE SODIUM PHOSPHATE 10 MG/ML IJ SOLN
INTRAMUSCULAR | Status: DC | PRN
  Administered 2021-11-26: 10 mg via INTRAVENOUS

## 2021-11-26 MED ORDER — HEPARIN SODIUM (PORCINE) 5000 UNIT/ML IJ SOLN
5000.0000 [IU] | Freq: Three times a day (TID) | INTRAMUSCULAR | Status: DC
  Administered 2021-11-26 – 2021-11-28 (×5): 5000 [IU] via SUBCUTANEOUS
  Filled 2021-11-26 (×4): qty 1

## 2021-11-26 MED ORDER — ACETAMINOPHEN 500 MG PO TABS
1000.0000 mg | ORAL_TABLET | Freq: Three times a day (TID) | ORAL | Status: DC
  Administered 2021-11-26 – 2021-11-28 (×6): 1000 mg via ORAL
  Filled 2021-11-26 (×6): qty 2

## 2021-11-26 MED ORDER — KETOROLAC TROMETHAMINE 30 MG/ML IJ SOLN: 30.0000 mg | Freq: Once | INTRAMUSCULAR | Status: DC | PRN

## 2021-11-26 MED ORDER — HEPARIN SODIUM (PORCINE) 5000 UNIT/ML IJ SOLN
5000.0000 [IU] | INTRAMUSCULAR | Status: AC
  Administered 2021-11-26: 5000 [IU] via SUBCUTANEOUS
  Filled 2021-11-26: qty 1

## 2021-11-26 MED ORDER — DOCUSATE SODIUM 100 MG PO CAPS
100.0000 mg | ORAL_CAPSULE | Freq: Two times a day (BID) | ORAL | Status: DC
  Administered 2021-11-26 – 2021-11-28 (×4): 100 mg via ORAL
  Filled 2021-11-26 (×4): qty 1

## 2021-11-26 MED ORDER — PHENYLEPHRINE 80 MCG/ML (10ML) SYRINGE FOR IV PUSH (FOR BLOOD PRESSURE SUPPORT)
PREFILLED_SYRINGE | INTRAVENOUS | Status: DC | PRN
  Administered 2021-11-26 (×2): 80 ug via INTRAVENOUS

## 2021-11-26 MED ORDER — ACETAMINOPHEN 500 MG PO TABS
1000.0000 mg | ORAL_TABLET | ORAL | Status: AC
  Administered 2021-11-26: 1000 mg via ORAL
  Filled 2021-11-26: qty 2

## 2021-11-26 MED ORDER — APREPITANT 40 MG PO CAPS
40.0000 mg | ORAL_CAPSULE | ORAL | Status: AC
  Administered 2021-11-26: 40 mg via ORAL
  Filled 2021-11-26: qty 1

## 2021-11-26 MED ORDER — ONDANSETRON HCL 4 MG/2ML IJ SOLN: 4.0000 mg | Freq: Once | INTRAMUSCULAR | Status: DC | PRN

## 2021-11-26 MED ORDER — ONDANSETRON HCL 4 MG/2ML IJ SOLN
4.0000 mg | INTRAMUSCULAR | Status: DC | PRN
  Administered 2021-11-26: 4 mg via INTRAVENOUS
  Filled 2021-11-26: qty 2

## 2021-11-26 MED ORDER — TRAMADOL HCL 50 MG PO TABS
50.0000 mg | ORAL_TABLET | Freq: Four times a day (QID) | ORAL | Status: DC | PRN
  Administered 2021-11-26: 50 mg via ORAL
  Filled 2021-11-26: qty 1

## 2021-11-26 MED ORDER — SODIUM CHLORIDE 0.9 % IV SOLN: INTRAVENOUS | Status: DC

## 2021-11-26 MED ORDER — SODIUM CHLORIDE 0.9 % IV SOLN
2.0000 g | INTRAVENOUS | Status: AC
  Administered 2021-11-26: 2 g via INTRAVENOUS
  Filled 2021-11-26: qty 2

## 2021-11-26 MED ORDER — LACTATED RINGERS IR SOLN
Status: DC | PRN
  Administered 2021-11-26: 1000 mL

## 2021-11-26 MED ORDER — BUPIVACAINE LIPOSOME 1.3 % IJ SUSP
INTRAMUSCULAR | Status: DC | PRN
  Administered 2021-11-26: 20 mL

## 2021-11-26 MED ORDER — DEXMEDETOMIDINE HCL IN NACL 80 MCG/20ML IV SOLN
INTRAVENOUS | Status: DC | PRN
  Administered 2021-11-26 (×2): 8 ug via BUCCAL

## 2021-11-26 MED ORDER — BUPIVACAINE LIPOSOME 1.3 % IJ SUSP
INTRAMUSCULAR | Status: AC
  Filled 2021-11-26: qty 20

## 2021-11-26 MED ORDER — HYDRALAZINE HCL 20 MG/ML IJ SOLN: 10.0000 mg | INTRAMUSCULAR | Status: DC | PRN

## 2021-11-26 MED ORDER — METOPROLOL TARTRATE 5 MG/5ML IV SOLN: 5.0000 mg | Freq: Four times a day (QID) | INTRAVENOUS | Status: DC | PRN

## 2021-11-26 MED ORDER — METOCLOPRAMIDE HCL 5 MG/ML IJ SOLN
10.0000 mg | Freq: Four times a day (QID) | INTRAMUSCULAR | Status: DC
  Administered 2021-11-26 – 2021-11-28 (×7): 10 mg via INTRAVENOUS
  Filled 2021-11-26 (×7): qty 2

## 2021-11-26 MED ORDER — SCOPOLAMINE 1 MG/3DAYS TD PT72
1.0000 | MEDICATED_PATCH | TRANSDERMAL | Status: DC
  Administered 2021-11-26: 1.5 mg via TRANSDERMAL
  Filled 2021-11-26: qty 1

## 2021-11-26 MED ORDER — HYDROXYZINE HCL 10 MG PO TABS: 10.0000 mg | ORAL_TABLET | Freq: Three times a day (TID) | ORAL | Status: DC | PRN

## 2021-11-26 MED ORDER — OXYCODONE HCL 5 MG/5ML PO SOLN: 5.0000 mg | Freq: Once | ORAL | Status: DC | PRN

## 2021-11-26 MED ORDER — ORAL CARE MOUTH RINSE: 15.0000 mL | Freq: Once | OROMUCOSAL | Status: AC

## 2021-11-26 MED ORDER — BUPIVACAINE LIPOSOME 1.3 % IJ SUSP: 20.0000 mL | Freq: Once | INTRAMUSCULAR | Status: DC

## 2021-11-26 MED ORDER — OXYCODONE HCL 5 MG/5ML PO SOLN: 5.0000 mg | Freq: Four times a day (QID) | ORAL | Status: DC | PRN

## 2021-11-26 MED ORDER — MIDAZOLAM HCL 2 MG/2ML IJ SOLN
INTRAMUSCULAR | Status: AC
  Filled 2021-11-26: qty 2

## 2021-11-26 MED ORDER — LIDOCAINE 2% (20 MG/ML) 5 ML SYRINGE
INTRAMUSCULAR | Status: DC | PRN
  Administered 2021-11-26: 100 mg via INTRAVENOUS

## 2021-11-26 MED ORDER — PROPOFOL 10 MG/ML IV BOLUS
INTRAVENOUS | Status: DC | PRN
  Administered 2021-11-26: 160 mg via INTRAVENOUS
  Administered 2021-11-26: 50 ug/kg/min via INTRAVENOUS

## 2021-11-26 MED ORDER — METHOCARBAMOL 500 MG IVPB - SIMPLE MED
INTRAVENOUS | Status: AC
  Administered 2021-11-26: 500 mg via INTRAVENOUS
  Filled 2021-11-26: qty 55

## 2021-11-26 MED ORDER — FENTANYL CITRATE (PF) 100 MCG/2ML IJ SOLN
INTRAMUSCULAR | Status: DC | PRN
  Administered 2021-11-26: 100 ug via INTRAVENOUS

## 2021-11-26 MED ORDER — PROPOFOL 10 MG/ML IV BOLUS
INTRAVENOUS | Status: AC
  Filled 2021-11-26: qty 20

## 2021-11-26 MED ORDER — BUPIVACAINE-EPINEPHRINE (PF) 0.25% -1:200000 IJ SOLN
INTRAMUSCULAR | Status: AC
  Filled 2021-11-26: qty 30

## 2021-11-26 MED ORDER — ACETAMINOPHEN 160 MG/5ML PO SOLN
1000.0000 mg | Freq: Three times a day (TID) | ORAL | Status: DC
  Filled 2021-11-26: qty 40.6

## 2021-11-26 MED ORDER — OXYCODONE HCL 5 MG PO TABS: 5.0000 mg | ORAL_TABLET | Freq: Once | ORAL | Status: DC | PRN

## 2021-11-26 MED ORDER — SUGAMMADEX SODIUM 500 MG/5ML IV SOLN
INTRAVENOUS | Status: DC | PRN
  Administered 2021-11-26: 250 mg via INTRAVENOUS

## 2021-11-26 MED ORDER — FENTANYL CITRATE (PF) 100 MCG/2ML IJ SOLN
INTRAMUSCULAR | Status: AC
  Filled 2021-11-26: qty 2

## 2021-11-26 MED ORDER — VARENICLINE TARTRATE 1 MG PO TABS
1.0000 mg | ORAL_TABLET | Freq: Every morning | ORAL | Status: DC
  Administered 2021-11-27: 1 mg via ORAL
  Filled 2021-11-26 (×2): qty 1

## 2021-11-26 SURGICAL SUPPLY — 71 items
APPLIER CLIP ROT 10 11.4 M/L (STAPLE)
BAG COUNTER SPONGE SURGICOUNT (BAG) IMPLANT
BAG LAPAROSCOPIC 12 15 PORT 16 (BASKET) IMPLANT
BAG RETRIEVAL 12/15 (BASKET)
BENZOIN TINCTURE PRP APPL 2/3 (GAUZE/BANDAGES/DRESSINGS) ×2 IMPLANT
BLADE SURG SZ11 CARB STEEL (BLADE) ×2 IMPLANT
BNDG ADH 1X3 SHEER STRL LF (GAUZE/BANDAGES/DRESSINGS) ×12 IMPLANT
CABLE HIGH FREQUENCY MONO STRZ (ELECTRODE) IMPLANT
CHLORAPREP W/TINT 26 (MISCELLANEOUS) ×4 IMPLANT
CLIP APPLIE ROT 10 11.4 M/L (STAPLE) IMPLANT
CLSR STERI-STRIP ANTIMIC 1/2X4 (GAUZE/BANDAGES/DRESSINGS) IMPLANT
COVER SURGICAL LIGHT HANDLE (MISCELLANEOUS) ×2 IMPLANT
DEVICE SUT QUICK LOAD TK 5 (SUTURE) IMPLANT
DEVICE SUT TI-KNOT TK 5X26 (SUTURE) IMPLANT
DRAPE UTILITY XL STRL (DRAPES) ×4 IMPLANT
ELECT REM PT RETURN 15FT ADLT (MISCELLANEOUS) ×2 IMPLANT
GAUZE SPONGE 4X4 12PLY STRL (GAUZE/BANDAGES/DRESSINGS) IMPLANT
GLOVE BIO SURGEON STRL SZ 6 (GLOVE) ×2 IMPLANT
GLOVE INDICATOR 6.5 STRL GRN (GLOVE) ×2 IMPLANT
GLOVE SS BIOGEL STRL SZ 6 (GLOVE) ×2 IMPLANT
GOWN STRL REUS W/ TWL LRG LVL3 (GOWN DISPOSABLE) ×2 IMPLANT
GOWN STRL REUS W/ TWL XL LVL3 (GOWN DISPOSABLE) IMPLANT
GOWN STRL REUS W/TWL LRG LVL3 (GOWN DISPOSABLE) ×2
GOWN STRL REUS W/TWL XL LVL3 (GOWN DISPOSABLE)
GRASPER SUT TROCAR 14GX15 (MISCELLANEOUS) ×2 IMPLANT
HEMOSTAT SNOW SURGICEL 2X4 (HEMOSTASIS) IMPLANT
IRRIG SUCT STRYKERFLOW 2 WTIP (MISCELLANEOUS) ×2
IRRIGATION SUCT STRKRFLW 2 WTP (MISCELLANEOUS) ×2 IMPLANT
KIT BASIN OR (CUSTOM PROCEDURE TRAY) ×2 IMPLANT
KIT TURNOVER KIT A (KITS) IMPLANT
MARKER SKIN DUAL TIP RULER LAB (MISCELLANEOUS) ×2 IMPLANT
MAT PREVALON FULL STRYKER (MISCELLANEOUS) ×2 IMPLANT
NDL SPNL 22GX3.5 QUINCKE BK (NEEDLE) ×2 IMPLANT
NEEDLE SPNL 22GX3.5 QUINCKE BK (NEEDLE) ×2 IMPLANT
PACK UNIVERSAL I (CUSTOM PROCEDURE TRAY) ×2 IMPLANT
RELOAD ENDO STITCH (ENDOMECHANICALS) IMPLANT
RELOAD STAPLE 60 3.6 BLU REG (STAPLE) ×2 IMPLANT
RELOAD STAPLE 60 3.8 GOLD REG (STAPLE) ×2 IMPLANT
RELOAD STAPLE 60 4.1 GRN THCK (STAPLE) IMPLANT
RELOAD STAPLER BLUE 60MM (STAPLE) ×6 IMPLANT
RELOAD STAPLER GOLD 60MM (STAPLE) ×2 IMPLANT
RELOAD STAPLER GREEN 60MM (STAPLE) IMPLANT
RELOAD SUT TRIPLE-STITCH 2-0 (ENDOMECHANICALS) IMPLANT
SCISSORS LAP 5X45 EPIX DISP (ENDOMECHANICALS) ×2 IMPLANT
SET TUBE SMOKE EVAC HIGH FLOW (TUBING) ×2 IMPLANT
SHEARS HARMONIC ACE PLUS 45CM (MISCELLANEOUS) ×2 IMPLANT
SLEEVE ADV FIXATION 5X100MM (TROCAR) ×4 IMPLANT
SLEEVE GASTRECTOMY 40FR VISIGI (MISCELLANEOUS) ×2 IMPLANT
SOL ANTI FOG 6CC (MISCELLANEOUS) ×2 IMPLANT
SOLUTION ANTI FOG 6CC (MISCELLANEOUS) ×2
SPIKE FLUID TRANSFER (MISCELLANEOUS) ×2 IMPLANT
STAPLE LINE REINFORCEMENT LAP (STAPLE) IMPLANT
STAPLER ECHELON LONG 60 440 (INSTRUMENTS) ×2 IMPLANT
STAPLER RELOAD BLUE 60MM (STAPLE) ×6
STAPLER RELOAD GOLD 60MM (STAPLE) ×2
STAPLER RELOAD GREEN 60MM (STAPLE)
STRIP CLOSURE SKIN 1/2X4 (GAUZE/BANDAGES/DRESSINGS) ×2 IMPLANT
SUT MNCRL AB 4-0 PS2 18 (SUTURE) ×2 IMPLANT
SUT SURGIDAC NAB ES-9 0 48 120 (SUTURE) IMPLANT
SUT VICRYL 0 TIES 12 18 (SUTURE) ×2 IMPLANT
SYR 10ML ECCENTRIC (SYRINGE) ×2 IMPLANT
SYR 20ML LL LF (SYRINGE) ×2 IMPLANT
SYR 50ML LL SCALE MARK (SYRINGE) ×2 IMPLANT
SYS KII OPTICAL ACCESS 15MM (TROCAR) ×2
SYSTEM KII OPTICAL ACCESS 15MM (TROCAR) ×2 IMPLANT
TOWEL OR 17X26 10 PK STRL BLUE (TOWEL DISPOSABLE) ×2 IMPLANT
TOWEL OR NON WOVEN STRL DISP B (DISPOSABLE) ×2 IMPLANT
TROCAR ADV FIXATION 5X100MM (TROCAR) ×2 IMPLANT
TROCAR XCEL NON-BLD 5MMX100MML (ENDOMECHANICALS) ×2 IMPLANT
TUBING CONNECTING 10 (TUBING) ×2 IMPLANT
TUBING ENDO SMARTCAP (MISCELLANEOUS) ×2 IMPLANT

## 2021-11-26 NOTE — Anesthesia Postprocedure Evaluation (Signed)
Anesthesia Post Note  Patient: Ashley Stanton  Procedure(s) Performed: LAPAROSCOPIC SLEEVE GASTRECTOMY (Abdomen) UPPER GI ENDOSCOPY (Esophagus)     Patient location during evaluation: PACU Anesthesia Type: General Level of consciousness: awake and alert Pain management: pain level controlled Vital Signs Assessment: post-procedure vital signs reviewed and stable Respiratory status: spontaneous breathing, nonlabored ventilation, respiratory function stable and patient connected to nasal cannula oxygen Cardiovascular status: blood pressure returned to baseline and stable Postop Assessment: no apparent nausea or vomiting Anesthetic complications: no   No notable events documented.  Last Vitals:  Vitals:   11/26/21 1145 11/26/21 1200  BP: 120/70 120/72  Pulse: 86 88  Resp: 16 12  Temp:  36.4 C  SpO2: 98% 96%    Last Pain:  Vitals:   11/26/21 1200  TempSrc:   PainSc: [place]

## 2021-11-26 NOTE — Interval H&P Note (Signed)
History and Physical Interval Note:  11/26/2021 8:55 AM  Ashley Stanton  has presented today for surgery, with the diagnosis of MORBID OBESITY.  The various methods of treatment have been discussed with the patient and family. After consideration of risks, benefits and other options for treatment, the patient has consented to  Procedure(s): LAPAROSCOPIC SLEEVE GASTRECTOMY (N/A) UPPER GI ENDOSCOPY (N/A) as a surgical intervention.  The patient's history has been reviewed, patient examined, no change in status, stable for surgery.  I have reviewed the patient's chart and labs.  Questions were answered to the patient's satisfaction.     Pacey Willadsen Rich Brave

## 2021-11-26 NOTE — Op Note (Signed)
   Patient: Ashley Stanton (04-Nov-1985, [id number])  Date of Surgery: 11/26/2021   Preoperative Diagnosis: MORBID OBESITY   Postoperative Diagnosis: MORBID OBESITY   Surgical Procedure: Upper Endoscopy   Surgeon: Louanna Raw, MD  Anesthesiologist: Barnet Glasgow, MD CRNA: Lavina Hamman, CRNA   Anesthesia: General   Fluids:  Total I/O In: -  Out: 71 [ALPFX:90]  Complications: None  Drains:  None  Specimen: None   Indications for Procedure: Ashley Stanton is a 36 y.o. female undergoing laparoscopic sleeve gastrectomy and an EGD was requested to evaluate foregut anatomy intraoperatively.  Description of Procedure: During the procedure, I scrubbed out and obtained the Olympus endoscope. I gently placed endoscope in the patient's oropharynx and gently glided it down the esophagus without any difficulty under direct visualization.  The scope was advanced as far as the pylorus and then slowly withdrawn to inspect the foregut anatomy.  Dr. Kae Heller had placed saline in the upper abdomen and all staple lines were submerged to ensure no air leak. There was no evidence of bubbles. There was no evidence of intraluminal bleeding and the mucosa appeared healthy.  The lumen was widely patent without evidence of stricture.  The intraluminal insufflation was decompressed. The scope was withdrawn. The patient tolerated this portion of the procedure well. Please see Dr Connot's operative note for details regarding the remainder of the procedure.    Louanna Raw, MD General, Bariatric, & Minimally Invasive Surgery [facility], [place]

## 2021-11-26 NOTE — Discharge Instructions (Signed)

## 2021-11-26 NOTE — Progress Notes (Signed)

## 2021-11-26 NOTE — Op Note (Signed)
Operative Note  AIYONNA LUCADO  [id number]  [id number]  11/26/2021   Surgeon: Romana Juniper MD FACS   Assistant: Louanna Raw MD    Procedure performed: laparoscopic sleeve gastrectomy, upper endoscopy   Preop diagnosis: Morbid obesity Body mass index is 46.2 kg/m. Post-op diagnosis/intraop findings: same   Specimens: fundus Retained items: none  EBL: minimal  Complications: none   Description of procedure: After obtaining informed consent and administration of chemical DVT prophylaxis in holding, the patient was taken to the operating room and placed supine on operating room table where general endotracheal anesthesia was initiated, preoperative antibiotics were administered, SCDs applied, and a formal timeout was performed. The abdomen was prepped and draped in usual sterile fashion. Peritoneal access was gained using a Visiport technique in the left upper quadrant and insufflation to 15 mmHg ensued without issue. Gross inspection revealed no evidence of injury. There are thin omental adhesions to the umbilical and lower midline which were left alone. Under direct visualization three more 5 mm trochars were placed in the right and left hemiabdomen and the 3mm trocar in the right paramedian upper abdomen. Bilateral laparoscopic assisted TAPS blocks were performed with Exparel diluted with 0.25 percent Marcaine with epinephrine. The patient was placed in steep reverseTrendelenburg and the liver retractor was introduced through an incision in the upper midline and secured to the post externally to maintain the left lobe retracted anteriorly.  There is no hiatal hernia on direct inspection. Using the Harmonic scalpel, the greater curvature of the stomach was dissected away from the greater omentum and short gastric vessels were divided. This began 6 cm from the pylorus, and dissection proceeded until the left crus was clearly exposed. The 34 [place] VisiGi was then introduced and directed  down towards the pylorus. This was placed to suction against the lesser curve. Serial fires of the linear cutting stapler with staple line reinforcements were then employed to create our sleeve. The first fire used a gold load and ensured adequate room at the angularis incisura. Several blue loads were then employed to create an evenly tubular stomach preserving 1cm lateral to the angle of His. The excised stomach was then removed through our 15 mm trocar site within an Endo Catch bag.  The visigi was taken off of suction and a few puffs of air were introduced, inflating the sleeve. No bubbles were observed in the irrigation fluid around the stomach and the shape was noted to be evenly tubular without any narrowing at the angularis. The visigi was then removed. Upper endoscopy was performed by the assistant surgeon and the sleeve was noted to be airtight, the staple line was hemostatic, and the lumen easily traversible to the pylorus without angulation or stricture. Please see his separate note. The endoscope was removed. The staple line was again inspected and confirmed to be hemostatic along with the rest of the intraabdominal survey. The 15 mm trocar site fascia in the right upper abdomen was closed with a 0 Vicryl using the laparoscopic suture passer under direct visualization. The liver retractor was removed under direct visualization. The abdomen was then desufflated and all remaining trochars removed. The skin incisions were closed with subcuticular 4-0 Monocryl; benzoin, Steri-Strips and Band-Aids were applied The patient was then awakened, extubated and taken to PACU in stable condition.     All counts were correct at the completion of the case.

## 2021-11-26 NOTE — Progress Notes (Signed)
PHARMACY CONSULT FOR:  Risk Assessment for Post-Discharge VTE Following Bariatric Surgery  Post-Discharge VTE Risk Assessment: This patient's probability of 30-day post-discharge VTE is increased due to the factors marked: X Sleeve gastrectomy   Liver disorder (transplant, cirrhosis, or nonalcoholic steatohepatitis)   Hx of VTE   Hemorrhage requiring transfusion   GI perforation, leak, or obstruction   ====================================================    Female    Age >/=60 years    BMI >/=50 kg/m2    CHF    Dyspnea at Rest    Paraplegia   X Non-gastric-band surgery    Operation Time >/=3 hr    Return to OR     Length of Stay >/= 3 d   Hypercoagulable condition   Significant venous stasis      Predicted probability of 30-day post-discharge VTE: 0.16%  Other patient-specific factors to consider: no  Recommendation for Discharge: No pharmacologic prophylaxis post-discharge  Ashley Stanton is a 36 y.o. female who underwent laparoscopic sleeve gastrectomy 11/26/2021    Allergies  Allergen Reactions   Alpha-Gal Hives and Itching   Pineapple Swelling    Lip swelling     Patient Measurements: Height: 5\' 3"  (160 cm) Weight: 118.3 kg (260 lb 12.8 oz) IBW/kg (Calculated) : 52.4 Body mass index is 46.2 kg/m.  No results for input(s): "WBC", "HGB", "HCT", "PLT", "APTT", "CREATININE", "LABCREA", "CREAT24HRUR", "MG", "PHOS", "ALBUMIN", "PROT", "AST", "ALT", "ALKPHOS", "BILITOT", "BILIDIR", "IBILI" in the last 72 hours. Estimated Creatinine Clearance: 109.9 mL/min (by C-G formula based on SCr of 0.88 mg/dL).    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    11/21/2021   etonogestrel (NEXPLANON) 68 MG IMPL implant 1 each (68 mg total) by Subdermal route once for 1 dose. 1 each 0 11/26/2021   metFORMIN (GLUCOPHAGE) 500 MG tablet Take 1 tablet (500 mg total) by mouth daily with breakfast. (Patient taking differently: Take 500 mg by mouth every evening.) 90 tablet 1 11/24/2021   varenicline (CHANTIX) 1 MG tablet Take 1 mg by mouth in the morning.   11/23/2021   hydrOXYzine (ATARAX) 10 MG tablet Take 10 mg by mouth every 8 (eight) hours as needed for anxiety.   More than a month   levothyroxine (SYNTHROID) 50 MCG tablet Take 1 tablet (50 mcg total) by mouth daily. (Patient not taking: Reported on 11/14/2021) 90 tablet 0 Not Taking   valACYclovir (VALTREX) 500 MG tablet Take 1 tablet (500 mg total) by mouth 2 (two) times daily as needed (herpes outbreak). Take 500 mg twice daily for 3 days at onset of flare. 30 tablet 0 More than a month    Eudelia Bunch, Pharm.D 11/26/2021 1:41 PM

## 2021-11-26 NOTE — Transfer of Care (Signed)
Immediate Anesthesia Transfer of Care Note  Patient: Ashley Stanton  Procedure(s) Performed: LAPAROSCOPIC SLEEVE GASTRECTOMY (Abdomen) UPPER GI ENDOSCOPY (Esophagus)  Patient Location: PACU  Anesthesia Type:General  Level of Consciousness: drowsy and patient cooperative  Airway & Oxygen Therapy: Patient Spontanous Breathing and Patient connected to face mask oxygen  Post-op Assessment: Report given to RN and Post -op Vital signs reviewed and stable  Post vital signs: Reviewed and stable  Last Vitals:  Vitals Value Taken Time  BP 113/69 11/26/21 1100  Temp    Pulse 98 11/26/21 1102  Resp 38 11/26/21 1102  SpO2 96 % 11/26/21 1102  Vitals shown include unvalidated device data.  Last Pain:  Vitals:   11/26/21 0840  TempSrc:   PainSc: 0-No pain         Complications: No notable events documented.

## 2021-11-26 NOTE — Anesthesia Procedure Notes (Signed)
Procedure Name: Intubation Date/Time: 11/26/2021 9:51 AM  Performed by: Lavina Hamman, CRNAPre-anesthesia Checklist: Patient identified, Emergency Drugs available, Suction available, Patient being monitored and Timeout performed Patient Re-evaluated:Patient Re-evaluated prior to induction Oxygen Delivery Method: Circle system utilized Preoxygenation: Pre-oxygenation with 100% oxygen Induction Type: IV induction Ventilation: Mask ventilation without difficulty Laryngoscope Size: Mac and 3 Grade View: Grade II Tube type: Oral Tube size: 7.0 mm Number of attempts: 1 Airway Equipment and Method: Stylet Placement Confirmation: ETT inserted through vocal cords under direct vision, positive ETCO2, CO2 detector and breath sounds checked- equal and bilateral Secured at: 21 cm Tube secured with: Tape Dental Injury: Teeth and Oropharynx as per pre-operative assessment  Comments: ATOI

## 2021-11-27 ENCOUNTER — Other Ambulatory Visit (HOSPITAL_COMMUNITY): Payer: Self-pay

## 2021-11-27 ENCOUNTER — Encounter (HOSPITAL_COMMUNITY): Payer: Self-pay | Admitting: Surgery

## 2021-11-27 LAB — COMPREHENSIVE METABOLIC PANEL
ALT: 23 U/L (ref 0–44)
AST: 21 U/L (ref 15–41)
Albumin: 3.1 g/dL — ABNORMAL LOW (ref 3.5–5.0)
Alkaline Phosphatase: 36 U/L — ABNORMAL LOW (ref 38–126)
Anion gap: 10 (ref 5–15)
BUN: 7 mg/dL (ref 6–20)
CO2: 21 mmol/L — ABNORMAL LOW (ref 22–32)
Calcium: 8.4 mg/dL — ABNORMAL LOW (ref 8.9–10.3)
Chloride: 105 mmol/L (ref 98–111)
Creatinine, Ser: 1 mg/dL (ref 0.44–1.00)
GFR, Estimated: >60 mL/min (ref >60)
Glucose, Bld: 103 mg/dL — ABNORMAL HIGH (ref 70–99)
Potassium: 3.7 mmol/L (ref 3.5–5.1)
Sodium: 136 mmol/L (ref 135–145)
Total Bilirubin: 0.5 mg/dL (ref 0.3–1.2)
Total Protein: 6.6 g/dL (ref 6.5–8.1)

## 2021-11-27 LAB — CBC
HCT: 34.9 % — ABNORMAL LOW (ref 36.0–46.0)
Hemoglobin: 11.1 g/dL — ABNORMAL LOW (ref 12.0–15.0)
MCH: 28.2 pg (ref 26.0–34.0)
MCHC: 31.8 g/dL (ref 30.0–36.0)
MCV: 88.8 fL (ref 80.0–100.0)
Platelets: 268 10*3/uL (ref 150–400)
RBC: 3.93 MIL/uL (ref 3.87–5.11)
RDW: 12.7 % (ref 11.5–15.5)
WBC: 12.7 10*3/uL — ABNORMAL HIGH (ref 4.0–10.5)
nRBC: 0 % (ref 0.0–0.2)

## 2021-11-27 LAB — MAGNESIUM: Magnesium: 1.9 mg/dL (ref 1.7–2.4)

## 2021-11-27 MED ORDER — PANTOPRAZOLE SODIUM 40 MG PO TBEC
40.0000 mg | DELAYED_RELEASE_TABLET | Freq: Every day | ORAL | 0 refills | Status: AC
  Filled 2021-11-27: qty 90, 90d supply, fill #0

## 2021-11-27 MED ORDER — TRAMADOL HCL 50 MG PO TABS
50.0000 mg | ORAL_TABLET | Freq: Four times a day (QID) | ORAL | 0 refills | Status: AC | PRN
  Filled 2021-11-27: qty 10, 3d supply, fill #0

## 2021-11-27 MED ORDER — ONDANSETRON 4 MG PO TBDP: 4.0000 mg | ORAL_TABLET | Freq: Four times a day (QID) | ORAL | Status: DC | PRN

## 2021-11-27 MED ORDER — ACETAMINOPHEN 500 MG PO TABS: 1000.0000 mg | ORAL_TABLET | Freq: Three times a day (TID) | ORAL | 0 refills | Status: AC

## 2021-11-27 MED ORDER — GABAPENTIN 100 MG PO CAPS
200.0000 mg | ORAL_CAPSULE | Freq: Two times a day (BID) | ORAL | 0 refills | Status: AC
  Filled 2021-11-27: qty 20, 5d supply, fill #0

## 2021-11-27 MED ORDER — ONDANSETRON 4 MG PO TBDP
4.0000 mg | ORAL_TABLET | Freq: Four times a day (QID) | ORAL | 0 refills | Status: AC | PRN
  Filled 2021-11-27: qty 20, 5d supply, fill #0

## 2021-11-27 NOTE — Progress Notes (Signed)
Rounded with patient after receiving Epic message from RN.  Pt's IV has infiltrated.  IV team consult placed after successful attempts.  Pt states that she "don't feel good" and had one episode of vomitus.  Has not started protein shakes.  With slow progression, pt most likely not ready for discharge today.  Will continue to monitor.

## 2021-11-27 NOTE — Discharge Summary (Signed)
Physician Discharge Summary  Ashley Stanton [id number]:[id number] DOB: [birth year] DOA: 11/26/2021  PCP: Vevelyn Francois, NP  Admit date: 11/26/2021 Discharge date: 11/28/21  Recommendations for Outpatient Follow-up:    Follow-up Information     Clovis Riley, MD. Go on 12/20/2021.   Specialty: General Surgery Why: at 4:10pm. Please arrive 15 minutes prior to your appointment time. Thank you Contact information: [address] [place] [address] [phone]         Clovis Riley, MD. Go on 01/17/2022.   Specialty: General Surgery Why: at 2:20pm. Please arrive 15 minutes prior to your appointment time. Thank you. Contact information: [address] [place] [address] [phone]                Discharge Diagnoses:  Principal Problem:   Morbid obesity ([place])   Surgical Procedure: Laparoscopic Sleeve Gastrectomy, upper endoscopy  Discharge Condition: Good Disposition: Home  Diet recommendation: Postoperative sleeve gastrectomy diet (liquids only)  Filed Weights   11/26/21 0819  Weight: 118.3 kg     Hospital Course:  The patient was admitted for a planned laparoscopic sleeve gastrectomy. Please see operative note. Preoperatively the patient was given 5000 units of subcutaneous heparin for DVT prophylaxis. Postoperative prophylactic heparin dosing was started on the evening of postoperative day 0. ERAS protocol was used. On the evening of postoperative day 0, the patient was started on water and ice chips. On postoperative day 1 the patient had no fever or tachycardia and was tolerating water and their diet was gradually advanced throughout the day. She did note some gas pain and typical epigastric pain, as well as a fair amount of nausea that did slow down her progress on POD 1. The patient was ambulating without difficulty. Their vital signs are stable without fever or tachycardia. Their hemoglobin had remained stable. By POD  2, her nausea had significantly improved and she was tolerating liquids/protein shakes well without any issue. The patient had received discharge instructions and counseling. They were deemed stable for discharge and had met discharge criteria   Today's Vitals   11/27/21 0118 11/27/21 0446 11/27/21 0940 11/27/21 1000  BP: 136/73 (!) 154/90 137/86   Pulse: 79 61 (!) 59   Resp: $Remo'18 16 18   'rEomZ$ Temp: 98.5 F (36.9 C) 98.9 F (37.2 C) 98.3 F (36.8 C)   TempSrc: Oral Oral Oral   SpO2: 99% 100% 99%   Weight:      Height:      PainSc:    0-No pain   Body mass index is 46.2 kg/m.   A&Ox 3 Unlabored respirations Abd s/nt/nd, incisions c/d/I with steri strips, no cellulitis or hematoma No LE edema  Discharge Instructions   Allergies as of 11/27/2021       Reactions   Alpha-gal Hives, Itching   Pineapple Swelling   Lip swelling         Medication List     STOP taking these medications    metFORMIN 500 MG tablet Commonly known as: GLUCOPHAGE       TAKE these medications    acetaminophen 500 MG tablet Commonly known as: TYLENOL Take 2 tablets (1,000 mg total) by mouth every 8 (eight) hours for 5 days.   cetirizine 10 MG tablet Commonly known as: ZYRTEC Take 10 mg by mouth daily as needed for allergies.   etonogestrel 68 MG Impl implant Commonly known as: NEXPLANON 1 each (68 mg total) by Subdermal route once  for 1 dose. Notes to patient: Medication may not be effective for the next 30 days.  Use precaution if necessary.    gabapentin 100 MG capsule Commonly known as: NEURONTIN Take 2 capsules (200 mg total) by mouth every 12 (twelve) hours.   hydrOXYzine 10 MG tablet Commonly known as: ATARAX Take 10 mg by mouth every 8 (eight) hours as needed for anxiety.   levothyroxine 50 MCG tablet Commonly known as: SYNTHROID Take 1 tablet (50 mcg total) by mouth daily. Notes to patient: Follow up with your PCP about whether you should be taking this medication.     ondansetron 4 MG disintegrating tablet Commonly known as: ZOFRAN-ODT Take 1 tablet (4 mg total) by mouth every 6 (six) hours as needed for nausea or vomiting.   pantoprazole 40 MG tablet Commonly known as: PROTONIX Take 1 tablet (40 mg total) by mouth daily. Take this medication daily, regardless of reflux symptoms, for the first 90 days after surgery   traMADol 50 MG tablet Commonly known as: ULTRAM Take 1 tablet (50 mg total) by mouth every 6 (six) hours as needed (pain).   valACYclovir 500 MG tablet Commonly known as: VALTREX Take 1 tablet (500 mg total) by mouth 2 (two) times daily as needed (herpes outbreak). Take 500 mg twice daily for 3 days at onset of flare.   varenicline 1 MG tablet Commonly known as: CHANTIX Take 1 mg by mouth in the morning.        Follow-up Information     Clovis Riley, MD. Go on 12/20/2021.   Specialty: General Surgery Why: at 4:10pm. Please arrive 15 minutes prior to your appointment time. Thank you Contact information: [address] [place] [address] [phone]         Clovis Riley, MD. Go on 01/17/2022.   Specialty: General Surgery Why: at 2:20pm. Please arrive 15 minutes prior to your appointment time. Thank you. Contact information: [address] [place] [address] [phone]                  The results of significant diagnostics from this hospitalization (including imaging, microbiology, ancillary and laboratory) are listed below for reference.    Significant Diagnostic Studies: No results found.  Labs: Basic Metabolic Panel: Recent Labs  Lab 11/27/21 0416  NA 136  K 3.7  CL 105  CO2 21*  GLUCOSE 103*  BUN 7  CREATININE 1.00  CALCIUM 8.4*  MG 1.9   Liver Function Tests: Recent Labs  Lab 11/27/21 0416  AST 21  ALT 23  ALKPHOS 36*  BILITOT 0.5  PROT 6.6  ALBUMIN 3.1*    CBC: Recent Labs  Lab 11/27/21 0416  WBC 12.7*  HGB 11.1*  HCT  34.9*  MCV 88.8  PLT 268    CBG: Recent Labs  Lab 11/26/21 0831  GLUCAP 117*    Principal Problem:   Morbid obesity ([place])   Signed:  [facility], [place] 11/27/2021, 11:41 AM

## 2021-11-27 NOTE — Progress Notes (Signed)
Patient alert and oriented, pain is controlled. Patient is tolerating fluids, still working on 2-oz cups of water with the plan to advance to protein shake today, patient is slow to progress.  Reviewed Gastric sleeve discharge instructions with patient and patient is able to articulate understanding.  Provided information on BELT program, Support Group and [facility] outpatient pharmacy. All questions answered, will continue to monitor.

## 2021-11-27 NOTE — TOC Progression Note (Signed)
Transition of Care [facility]) - Progression Note    Patient Details  Name: Ashley Stanton MRN: [id number] Date of Birth: [birth year]  [facility]) CM/SW Contact  Purcell Mouton, RN Phone Number: 11/27/2021, 8:59 AM  Clinical Narrative:      Transition of Care (TOC) Screening Note   Patient Details  Name: Ashley Stanton Date of Birth: 11/05/1985   Transition of Care [facility]) CM/SW Contact:    Purcell Mouton, RN Phone Number: 11/27/2021, 8:59 AM    Transition of Care Department (TOC) has reviewed patient and no TOC needs have been identified at this time. We will continue to monitor patient advancement through interdisciplinary progression rounds. If new patient transition needs arise, please place a TOC consult.         Expected Discharge Plan and Services                                                 Social Determinants of Health (SDOH) Interventions    Readmission Risk Interventions     No data to display

## 2021-11-28 ENCOUNTER — Other Ambulatory Visit (HOSPITAL_COMMUNITY): Payer: Self-pay

## 2021-11-28 LAB — SURGICAL PATHOLOGY

## 2021-11-28 NOTE — Plan of Care (Signed)
  Problem: Education: Goal: Ability to state signs and symptoms to report to health care provider will improve Outcome: Adequate for Discharge Goal: Knowledge of the prescribed self-care regimen will improve Outcome: Adequate for Discharge Goal: Knowledge of discharge needs will improve Outcome: Adequate for Discharge   Problem: Activity: Goal: Ability to tolerate increased activity will improve Outcome: Adequate for Discharge   Problem: Bowel/Gastric: Goal: Gastrointestinal status for postoperative course will improve Outcome: Adequate for Discharge Goal: Occurrences of nausea will decrease Outcome: Adequate for Discharge   Problem: Coping: Goal: Development of coping mechanisms to deal with changes in body function or appearance will improve Outcome: Adequate for Discharge   Problem: Fluid Volume: Goal: Maintenance of adequate hydration will improve Outcome: Adequate for Discharge   Problem: Nutritional: Goal: Nutritional status will improve Outcome: Adequate for Discharge   Problem: Clinical Measurements: Goal: Will show no signs or symptoms of venous thromboembolism Outcome: Adequate for Discharge Goal: Will remain free from infection Outcome: Adequate for Discharge Goal: Will show no signs of GI Leak Outcome: Adequate for Discharge   Problem: Respiratory: Goal: Will regain and/or maintain adequate ventilation Outcome: Adequate for Discharge   Problem: Pain Management: Goal: Pain level will decrease Outcome: Adequate for Discharge   Problem: Skin Integrity: Goal: Demonstration of wound healing without infection will improve Outcome: Adequate for Discharge   Problem: Education: Goal: Knowledge of General Education information will improve Description: Including pain rating scale, medication(s)/side effects and non-pharmacologic comfort measures Outcome: Adequate for Discharge   Problem: Health Behavior/Discharge Planning: Goal: Ability to manage  health-related needs will improve Outcome: Adequate for Discharge   Problem: Clinical Measurements: Goal: Ability to maintain clinical measurements within normal limits will improve Outcome: Adequate for Discharge Goal: Will remain free from infection Outcome: Adequate for Discharge Goal: Diagnostic test results will improve Outcome: Adequate for Discharge Goal: Respiratory complications will improve Outcome: Adequate for Discharge Goal: Cardiovascular complication will be avoided Outcome: Adequate for Discharge   Problem: Activity: Goal: Risk for activity intolerance will decrease Outcome: Adequate for Discharge   Problem: Nutrition: Goal: Adequate nutrition will be maintained Outcome: Adequate for Discharge   Problem: Coping: Goal: Level of anxiety will decrease Outcome: Adequate for Discharge   Problem: Elimination: Goal: Will not experience complications related to bowel motility Outcome: Adequate for Discharge Goal: Will not experience complications related to urinary retention Outcome: Adequate for Discharge   Problem: Pain Managment: Goal: General experience of comfort will improve Outcome: Adequate for Discharge   Problem: Safety: Goal: Ability to remain free from injury will improve Outcome: Adequate for Discharge   Problem: Skin Integrity: Goal: Risk for impaired skin integrity will decrease Outcome: Adequate for Discharge   

## 2021-11-28 NOTE — Progress Notes (Signed)
Assessment unchanged. Pt verbalized understanding of dc instructions including medications and follow up care. Madlyn Frankel, RN in to to give bariatric teaching earlier. No further questions. Discharged via wc to front entrance by NT.

## 2021-11-28 NOTE — Progress Notes (Signed)
Post op day 2. Patient is more alert this morning in comparison to yesterday and sitting up in recliner.  Pt has consumed 11oz of protein shakes without nausea. Denies vomiting. Provided support and encouragement.  Encouraged pulmonary toilet, ambulation and small sips of liquids.  All questions answered.  Shared update with Dr. Kae Heller.  Anticipate discharge today. Will continue to monitor and follow up per protocol.

## 2021-11-28 NOTE — Progress Notes (Addendum)
S: relatively uneventful night. Sipping water without issue. Has walked several times. Some nausea, controlled with meds.   O: Vitals, labs, intake/output, and orders reviewed at this time. Afebrile, no tachycardia, mildly hypertensive. Sats 99% RA. PO 240, UOP 1100. CMP unremarkable; WBC 12.7 (6.8 preop), Hgb 11.1 (12.7), plts 268 (287)  PRN meds- simethicone x 2, no pain or nausea PRNs.   Gen: A&Ox3, no distress  H&N: EOMI, atraumatic, neck supple Chest: unlabored respirations, RRR Abd: soft, nontender, nondistended, incision(s) c/d/i no cellulitis or hematoma Ext: warm, no edema Neuro: grossly normal  Lines/tubes/drains: PIV  A/P: POD 1 s/p sleeve gastrectomy, doing well -Continue clears, advance to protein shakes -Continue SCS while in bed, heparin ppx, aggressive pulm toilet and ambulation -Anticipate discharge later today if continues to progress   Romana Juniper, MD [facility], [place]

## 2021-12-03 ENCOUNTER — Telehealth (HOSPITAL_COMMUNITY): Payer: Self-pay | Admitting: *Deleted

## 2021-12-03 NOTE — Telephone Encounter (Signed)
1.  Tell me about your pain and pain management? Pt denies any current pain.States that she was having some pain last night, but think it may have been from "drinking too much cream soup and broth before bed".    2.  Let's talk about fluid intake.  How much total fluid are you taking in? Pt states that she is getting in at least 64oz of fluid including protein shakes, bottled water, Gatorade, broth, popsicles, and cream soups.  3.  How much protein have you taken in the last 2 days? Pt states she is meeting her goal of 60g of protein each day with the protein shakes.  4.  Have you had nausea?  Tell me about when have experienced nausea and what you did to help? Pt denies nausea.   5.  Has the frequency or color changed with your urine? Pt states that she is urinating "fine" with no changes in frequency or urgency.     6.  Tell me what your incisions look like? "Incisions look fine". Pt denies a fever, chills.  Pt states incisions are not swollen, open, or draining.  Pt encouraged to call [facility] if incisions change.   7.  Have you been passing gas? BM? Pt states that she is having BMs. Last BM 12/02/21.     8.  If a problem or question were to arise who would you call?  Do you know contact numbers for [facility], [facility], and [facility]? Pt denies dehydration symptoms.  Pt can describe s/sx of dehydration.  Pt knows to call [facility] for surgical, [facility] for nutrition, and [place] for non-urgent questions or concerns.   9.  How has the walking going? Pt states she is walking around and able to be active without difficulty.   10. Are you still using your incentive spirometer?  If so, how often? Pt states that she is not doing the I.S. now but when she had a cough, she was doing it more. Now her cough is resolved.  Pt encouraged to use incentive spirometer, at least 10x every hour while awake until he sees the surgeon.  11.  How are your vitamins and calcium going?  How are you taking them? Pt states that she is taking  her supplements and vitamins without difficulty. Reinforced education about taking supplements at least two hours apart.  Reminded patient that the first 30 days post-operatively are important for successful recovery.  Practice good hand hygiene, wearing a mask when appropriate (since optional in most places), and minimizing exposure to people who live outside of the home, especially if they are exhibiting any respiratory, GI, or illness-like symptoms.

## 2021-12-04 ENCOUNTER — Other Ambulatory Visit (HOSPITAL_COMMUNITY): Payer: Self-pay | Admitting: Nurse Practitioner

## 2021-12-10 ENCOUNTER — Encounter: Payer: Managed Care, Other (non HMO) | Attending: Surgery | Admitting: Dietician

## 2021-12-10 ENCOUNTER — Encounter: Payer: Self-pay | Admitting: Dietician

## 2021-12-10 VITALS — Ht 63.0 in | Wt 246.3 lb

## 2021-12-10 DIAGNOSIS — E669 Obesity, unspecified: Secondary | ICD-10-CM | POA: Insufficient documentation

## 2021-12-10 NOTE — Progress Notes (Signed)
2 Week Post-Operative Nutrition Class   Patient was seen on 12/10/2021 for Post-Operative Nutrition education at the [facility].    Surgery date: 11/26/2021 Surgery type: sleeve  Anthropometrics  Start weight at [facility]: 253 lbs (date: 02/20/2020)  Height: 63 in Weight today: 246.3 lbs. BMI: 43.63 kg/m2     Clinical  Medical hx: post partum depression  Medications: N/A Labs:  Notable signs/symptoms: headaches, hx bells palsy Any previous deficiencies? No Bowel Habits: Every day to every other day no complaints   Body Composition Scale 12/10/2021  Current Body Weight 246.3  Total Body Fat % 44.7  Visceral Fat 14  Fat-Free Mass % 55.2   Total Body Water % 42.1  Muscle-Mass lbs 32.0  BMI 43.2  Body Fat Displacement          Torso  lbs 68.3         Left Leg  lbs 13.6         Right Leg  lbs 13.6         Left Arm  lbs 6.8         Right Arm   lbs 6.8      The following the learning objectives were met by the patient during this course: Identifies Phase 3 (Soft, High Proteins) Dietary Goals and will begin from 2 weeks post-operatively to 2 months post-operatively Identifies appropriate sources of fluids and proteins  Identifies appropriate fat sources and healthy verses unhealthy fat types   States protein recommendations and appropriate sources post-operatively Identifies the need for appropriate texture modifications, mastication, and bite sizes when consuming solids Identifies appropriate fat consumption and sources Identifies appropriate multivitamin and calcium sources post-operatively Describes the need for physical activity post-operatively and will follow MD recommendations States when to call healthcare provider regarding medication questions or post-operative complications   Handouts given during class include: Phase 3A: Soft, High Protein Diet Handout Phase 3 High Protein Meals Healthy Fats   Follow-Up Plan: Patient will follow-up  at [facility] in 6 weeks for 2 month post-op nutrition visit for diet advancement per MD.

## 2021-12-17 ENCOUNTER — Telehealth: Payer: Self-pay | Admitting: Dietician

## 2021-12-17 NOTE — Telephone Encounter (Signed)
RD called pt to verify fluid intake once starting soft, solid proteins 2 week post-bariatric surgery (sleeve surgery).   Daily Fluid intake: 32-48 oz Daily Protein intake: 40-50 grams Bowel Habits:   Concerns/issues:  pt states she can only take micro bites, stating she is feeling exhausted and fatigued. Pt states it takes her 2 hours to eat a couple of teaspoons of beans or meat.  Pt states the protein shakes are expensive, but she bought some to help supplement her protein intake, stating she is not getting enough from the food.  Pt states she is scared about the little amount she is able to eat and drink. Pt states she is not urinating enough.  Pt states she can tolerate the yogurt some and milk.  Dietitian advised that she continue to supplement with the shakes and yogurt, and drink some milk to offer some protein and fluid.  Dietitian advised that she call the surgeons office with concerns.  Pt stated she would call after our encounter today.

## 2022-01-08 ENCOUNTER — Other Ambulatory Visit: Payer: Self-pay | Admitting: Nurse Practitioner

## 2022-01-08 DIAGNOSIS — N939 Abnormal uterine and vaginal bleeding, unspecified: Secondary | ICD-10-CM

## 2022-01-14 ENCOUNTER — Ambulatory Visit
Admission: RE | Admit: 2022-01-14 | Discharge: 2022-01-14 | Disposition: A | Discharge status: Home / Self Care | Payer: Managed Care, Other (non HMO) | Source: Ambulatory Visit | Attending: Nurse Practitioner | Admitting: Nurse Practitioner

## 2022-01-14 DIAGNOSIS — N939 Abnormal uterine and vaginal bleeding, unspecified: Secondary | ICD-10-CM | POA: Insufficient documentation

## 2022-01-23 ENCOUNTER — Encounter: Payer: Self-pay | Admitting: Dietician

## 2022-01-23 ENCOUNTER — Encounter: Payer: Managed Care, Other (non HMO) | Attending: Surgery | Admitting: Dietician

## 2022-01-23 VITALS — Ht 63.0 in | Wt 234.4 lb

## 2022-01-23 DIAGNOSIS — N1831 Chronic kidney disease, stage 3a: Secondary | ICD-10-CM | POA: Insufficient documentation

## 2022-01-23 DIAGNOSIS — Z9884 Bariatric surgery status: Secondary | ICD-10-CM | POA: Insufficient documentation

## 2022-01-23 DIAGNOSIS — E039 Hypothyroidism, unspecified: Secondary | ICD-10-CM | POA: Diagnosis not present

## 2022-01-23 DIAGNOSIS — Z6841 Body Mass Index (BMI) 40.0 and over, adult: Secondary | ICD-10-CM | POA: Diagnosis not present

## 2022-01-23 DIAGNOSIS — R7303 Prediabetes: Secondary | ICD-10-CM | POA: Diagnosis not present

## 2022-01-23 DIAGNOSIS — F17210 Nicotine dependence, cigarettes, uncomplicated: Secondary | ICD-10-CM | POA: Diagnosis not present

## 2022-01-23 NOTE — Progress Notes (Signed)
Nutrition Therapy for Post-Operative Bariatric Diet Follow-up visit: 2 months  post-op sleeve gastrectomy surgery  Medical Nutrition Therapy:  Appt start time: 1115 end time:  1145  Anthropometrics: Body composition measurement taken on InBody scale today; was done on different scale at previous visits.  Date 10/28/21 12/10/21 01/23/22  BMI 48.18 43.63 41.5  Weight (lbs) 272 246.3 234.4  Skeletal muscle (lbs)  32.0 68.1  % body fat  44.7 48.3   Clinical: Medications: cetirizine prn, Nexplanon implant, pantoprazole, valACYclovir prn Supplementation: bariatric multivitamin, hair skin and nails (biotin, collagen), calcium carbonate (Tums) 2x daily Health/ medical history changes: recent diagnosis of uterine fibroid tumors (small) GI symptoms: N/V/D/C: occasional constipation which resolves with mom or miralax, not needed for past 2 weeks Dumping Syndrome: no Hair loss: no  Dietary/ Lifestyle Progress: Patient reports weight loss plateau for several weeks, has now passed the plateau and has begun losing again. Total of about 40lbs since surgery She has resumed smoking about 1/2 pack daily. She reports progress with food volume is slow; she has resumed drinking one protein shake daily to ensure adequate intake. She feels she is grieving inability to eat what others are eating and inability to use food for comfort, stress, etc.  Dietary recall: Eating pattern: 3 meals, 1-2 snacks Dining out: rarely; often stays home even when family goes out due to inability to eat much and then having to watch them eat Breakfast: protein shake fairlife or ensure max protein  Snack: none  Lunch: leftovers 1/2 - 1oz meat (eats more 2 hours later);12/20 spaghetti (ground [place], very little pasta) and green beans Snack: occasionally remainder of lunch or Ratio/ Keto high protein yogurt or nuts (small handful); rarely peanut butter crackers (2 out of pkg of 6) Dinner: 1-2oz pork chop + broccoli; often ground  turkey/ chicken; occasionally steak/ salmon (not often, daughter is allergic) + veg + sometimes few bites rice or pasta, or few bites of bread Snack: none; occasionally more of meat from dinner  Fluid intake: water 64oz+ daily, about 2oz coffee in am; 1 protein shake daily Estimated total protein intake: 60g (1 shake + 2oz meat + yogurt) Bariatric diet adherence:  Using straws: occasionally at a restuarant Drinking fluids during meals: no Carbonated beverages: no  Recent physical activity:  walking on track up to 1 mile 1-2 times daily at work   Nutrition Intervention:   Reviewed progress since previous visit Advised comparing multivitamin label to daily recommendations for sleeve gastrectomy and adding extra supplement(s) for any deficits. Instructed on advancing diet to build variety of veg and protein foods, discussed options for adding supplemental protein to foods. Reviewed prioritizing order of eating.  Discussed factors in promoting [place] term weight loss success including low fat and low sugar choices, regular exercise including cardiovascular and strength training.  Discussed emotional eating/ relationship with food; encouraged use of stress management activities as new habits to replace eating.   Nutritional Diagnosis:  [place]-3.3 Overweight/obesity As related to history of excess calories and inadequate physical activity.  As evidenced by patient with current BMI of 41.5, following post-op bariatric diet guidelines for ongoing weight loss after sleeve gastrectomy.  Teaching Method Utilized:  Visual Auditory Hands on  Materials provided: Phase 4 and 5 bariatric diet handouts 52 Proven Stress Reducers 100 Things to do Besides Eat Visit summary to be viewed via patient portal  Learning Readiness:  Change in progress  Barriers to learning/adherence to lifestyle change: none  Demonstrated degree of understanding via:  Teach Back      Plan: Work on goals as listed in  Patient Instructions Return for 6 month follow-up MNT on 05/26/22, or sooner if needed.

## 2022-01-23 NOTE — Patient Instructions (Signed)
Continue to add more variety of foods, especially protein and low carb vegetables, always keeping fat and sugar/ carbs low. Keep up the regular exercise, great job! Make sure vitamins meet the daily needs for bariatric surgery.

## 2022-05-26 ENCOUNTER — Encounter: Payer: Self-pay | Admitting: Dietician

## 2022-05-26 ENCOUNTER — Encounter: Payer: Managed Care, Other (non HMO) | Attending: Nurse Practitioner | Admitting: Dietician

## 2022-05-26 VITALS — Ht 63.0 in | Wt 215.1 lb

## 2022-05-26 DIAGNOSIS — E669 Obesity, unspecified: Secondary | ICD-10-CM | POA: Insufficient documentation

## 2022-05-26 DIAGNOSIS — Z6838 Body mass index (BMI) 38.0-38.9, adult: Secondary | ICD-10-CM | POA: Insufficient documentation

## 2022-05-26 DIAGNOSIS — E6609 Other obesity due to excess calories: Secondary | ICD-10-CM

## 2022-05-26 DIAGNOSIS — Z713 Dietary counseling and surveillance: Secondary | ICD-10-CM | POA: Diagnosis not present

## 2022-05-26 DIAGNOSIS — Z9884 Bariatric surgery status: Secondary | ICD-10-CM | POA: Diagnosis not present

## 2022-05-26 NOTE — Patient Instructions (Signed)
Great job working to manage sugar intake despite craving! Use fruit, including frozen fruits to satisfy sugar craving. Resume calcium supplement and take multivitamin daily Allow up to 30 minutes to finish a meal, then leave the rest. Wait at least 2-3 hours before  the next snack or meal. Continue regular exercise to maintain healthy metabolism and ongoing weight loss.

## 2022-05-26 NOTE — Progress Notes (Signed)
Nutrition Therapy for Post-Operative Bariatric Diet Follow-up visit:  6 months post-op sleeve gastrectomy surgery  Medical Nutrition Therapy:  Appt start time: 1530 end time:  1620  Anthropometrics:  Date 10/28/21 12/10/21 01/23/22 05/26/22  BMI 48.18 43.63 41.5 38.1  Weight (lbs) 272 246.3 234.4 215.1  Skeletal muscle (lbs)   32.0 68.1 68.6  % body fat   44.7 48.3 42.6    Clinical: Medications: cetirizine, doxycycline prn, etonogestrel implant, pantoprazole Supplementation: women's multivitamin once 3-4 times a week; stopped calcium supplement Health/ medical history changes: no changes GI symptoms: N/V/D/C: none Dumping Syndrome: none Hair loss: minor per patient  Dietary/ Lifestyle Progress: Reports some craving for sugar, which was a problem prior to surgery.  She had begun eating bite-size candy bars but has worked to eliminate again.  Feels she is able to eat larger portions at a time than at previous visit, but still small, and will continue eating gradually until she finishes her food, often getting up and doing a task then returning to eat more -- grazing pattern. Exercise has increased, and body composition has improved. Reports inadequate sleep due to working 2 jobs, 12-16 hours of work most days. Work hours also contribute to higher stress level.    Dietary recall: Eating pattern: 3 meals and 1-2 snacks Dining out: rarely Breakfast: sometimes 4oz cottage cheese with fruit; just crack an egg bowl; yogurt with protein granola; protein shake; drinks coffee with creamer no extra sugar Snack: nuts, cheese  Lunch: usually leftovers 4/22 oxtail and brown rice, cabbage; tuna wrap; rarely salad (lettuce doesn't digest well) Snack: same as am  Dinner: lean meat; ground beef patty with gravy, green beans, few bites mashed potatoes Snack: none  Fluid intake: 40-80oz daily + 10oz coffee Estimated total protein intake: 60g average daily (55-65g) Bariatric diet adherence:  Using  straws: sometimes Drinking fluids during meals: no; occasional sips as needed Carbonated beverages: no  Recent physical activity:  workouts at gym 3-4 times a week average, 2 miles on treadmill + 30 minutes crunches/ strength training 60-90 minutes total   Nutrition Intervention:   Reviewed progress since previous visit (01/23/22) Discussed causes/ triggers of craving for sugar and options for managing; healthy food options, managing stress, adequate sleep Reviewed appropriate nutrient balance in meals Advised ending a meal after 30 minutes and then waiting at least 2-3 hours before eating again to avoid constant grazing.  Reviewed vitamin and mineral needs.  Nutritional Diagnosis:  [place]-3.3 Overweight/obesity As related to history of excess calories and inadequate physical activity.  As evidenced by patient with current BMI of 38, following post-op bariatric diet guidelines for ongoing weight loss after sleeve gastrectomy.  Teaching Method Utilized:  Visual Auditory Hands on  Materials provided: 6 month Ongoing Healthy Eating packet Visit summary with goals to be viewed via patient portal  Learning Readiness:  Change in progress  Barriers to learning/adherence to lifestyle change: none  Demonstrated degree of understanding via:  Teach Back      Plan: Work on goals established during visit Return for 12 month post-op visit 11/27/22 (patient does not feel she needs 9 month visit)

## 2022-11-27 ENCOUNTER — Ambulatory Visit: Payer: Managed Care, Other (non HMO) | Admitting: Dietician

## 2023-07-03 ENCOUNTER — Encounter (HOSPITAL_COMMUNITY): Payer: Self-pay | Admitting: *Deleted

## 2023-09-15 ENCOUNTER — Ambulatory Visit

## 2023-09-15 VITALS — BP 113/77 | HR 97 | Ht 63.0 in | Wt 205.0 lb

## 2023-09-15 DIAGNOSIS — L987 Excessive and redundant skin and subcutaneous tissue: Secondary | ICD-10-CM | POA: Diagnosis not present

## 2023-09-15 DIAGNOSIS — Z9884 Bariatric surgery status: Secondary | ICD-10-CM

## 2023-09-15 DIAGNOSIS — Z6836 Body mass index (BMI) 36.0-36.9, adult: Secondary | ICD-10-CM

## 2023-09-15 DIAGNOSIS — F17200 Nicotine dependence, unspecified, uncomplicated: Secondary | ICD-10-CM

## 2023-09-15 DIAGNOSIS — M793 Panniculitis, unspecified: Secondary | ICD-10-CM | POA: Diagnosis not present

## 2023-09-15 DIAGNOSIS — Z720 Tobacco use: Secondary | ICD-10-CM

## 2023-09-15 NOTE — Progress Notes (Signed)
 Plastic & Reconstructive Surgery New Patient Visit  Patient: Ashley Stanton MRN: [id number] Date: 09/15/23  Referring Provider: Myrna Salines    Chief Complaint: Excess skin   History of Present Illness:  This is a 38 y.o. female with PMH and PSH as presented below who presents for consultation for body contouring following massive weight loss. The patient underwent laparoscopic sleeve gastrectomy in 2013. Prior to the surgery, her highest weight was 205 lbs. Since then, she has lost almost 278 lbs. Her weight today is 205 lbs and Body mass index is 36.31 kg/m.. With the weight loss she has noted excess skin involving abdomen. Pt complains of rashes in between the skin folds which persist despite antifungal creams and powders. No known nutritional issues. No hernias. Other abdominal surgeries include C-Sections. Pt is otherwise healthy and does not smoke. Functional status is good.    Past Medical History: Past Medical History:  Diagnosis Date   Allergy    SEASONAL   Bell's palsy 05/08/2014   Bell's palsy 2016   Bell's palsy    Hidradenitis axillaris    Pre-diabetes    Thyroid disease     Past Surgical History: Past Surgical History:  Procedure Laterality Date   CESAREAN SECTION  02/11/2009   CESAREAN SECTION N/A 06/17/2015   Procedure: REPEAT CESAREAN SECTION;  Surgeon: Garnette JONETTA Mace, MD;  Location: [facility];  Service: Obstetrics;  Laterality: N/A;   HYDRADENITIS EXCISION Left 08/10/2018   Procedure: EXCISION OF LEFT AXILLARY HIDRADENITIS;  Surgeon: Signe Mitzie DELENA, MD;  Location: [facility] OR;  Service: General;  Laterality: Left;   LAPAROSCOPIC GASTRIC SLEEVE RESECTION N/A 11/26/2021   Procedure: LAPAROSCOPIC SLEEVE GASTRECTOMY;  Surgeon: Signe Mitzie DELENA, MD;  Location: [facility];  Service: General;  Laterality: N/A;   UPPER GI ENDOSCOPY N/A 11/26/2021   Procedure: UPPER GI ENDOSCOPY;  Surgeon: Signe Mitzie DELENA, MD;  Location: [facility];  Service: General;  Laterality: N/A;     Current Medications: Current Outpatient Medications on File Prior to Visit  Medication Sig Dispense Refill   BIOTIN PO Take 1 tablet by mouth daily. Biotin with collagen supplement     calcium  carbonate (TUMS EX) 750 MG chewable tablet Chew by mouth.     cetirizine (ZYRTEC) 10 MG tablet Take 10 mg by mouth daily as needed for allergies.     doxycycline  (ADOXA) 100 MG tablet Take 100 mg by mouth 2 (two) times daily.     etonogestrel  (NEXPLANON ) 68 MG IMPL implant 1 each (68 mg total) by Subdermal route once for 1 dose. 1 each 0   hydrOXYzine  (ATARAX ) 10 MG tablet Take 10 mg by mouth every 8 (eight) hours as needed for anxiety.     Multiple Vitamins-Minerals (WOMENS MULTIVITAMIN PO) Take 1 tablet by mouth daily. Not a bariatric formula     valACYclovir  (VALTREX ) 500 MG tablet Take 1 tablet (500 mg total) by mouth 2 (two) times daily as needed (herpes outbreak). Take 500 mg twice daily for 3 days at onset of flare. 30 tablet 0   amoxicillin-clavulanate (AUGMENTIN) 875-125 MG tablet Take 1 tablet by mouth 2 (two) times daily. (Patient not taking: Reported on 09/15/2023)     gabapentin  (NEURONTIN ) 100 MG capsule Take 2 capsules (200 mg total) by mouth every 12 (twelve) hours. (Patient not taking: Reported on 09/15/2023) 20 capsule 0   levothyroxine  (SYNTHROID ) 50 MCG tablet Take 1 tablet (50 mcg total) by mouth daily. (Patient not taking: Reported on 09/15/2023) 90 tablet 0  ondansetron  (ZOFRAN -ODT) 4 MG disintegrating tablet Dissolve 1 tablet (4 mg total) by mouth every 6 (six) hours as needed for nausea or vomiting. (Patient not taking: Reported on 09/15/2023) 20 tablet 0   pantoprazole  (PROTONIX ) 40 MG tablet Take 1 tablet (40 mg total) by mouth daily. Take this medication daily, regardless of reflux symptoms, for the first 90 days after surgery (Patient not taking: Reported on 09/15/2023) 90 tablet 0   traMADol  (ULTRAM ) 50 MG tablet Take 1 tablet (50 mg total) by mouth every 6 (six) hours as needed  (pain). (Patient not taking: Reported on 09/15/2023) 10 tablet 0   varenicline  (CHANTIX ) 1 MG tablet Take 1 mg by mouth in the morning. (Patient not taking: Reported on 09/15/2023)     No current facility-administered medications on file prior to visit.    Allergies: Allergies  Allergen Reactions   Alpha-Gal Hives and Itching   Pineapple Swelling    Lip swelling     Family History:  Family history is negative for bleeding/clotting disorders, problems with anesthesia, or connective tissue disorders.   Social History:  Social History   Socioeconomic History   Marital status: Single    Spouse name: Not on file   Number of children: Not on file   Years of education: Not on file   Highest education level: Not on file  Occupational History   Not on file  Tobacco Use   Smoking status: Every Day    Current packs/day: 0.50    Types: Cigarettes   Smokeless tobacco: Never  Vaping Use   Vaping status: Never Used  Substance and Sexual Activity   Alcohol use: Yes    Comment: occasionally   Drug use: No   Sexual activity: Yes    Birth control/protection: Condom, Implant  Other Topics Concern   Not on file  Social History Narrative   Not on file   Social Drivers of Health   Financial Resource Strain: Not on file  Food Insecurity: No Food Insecurity (11/26/2021)   Hunger Vital Sign    Worried About Running Out of Food in the Last Year: Never true    Ran Out of Food in the Last Year: Never true  Transportation Needs: No Transportation Needs (11/26/2021)   PRAPARE - Administrator, Civil Service (Medical): No    Lack of Transportation (Non-Medical): No  Physical Activity: Not on file  Stress: Not on file  Social Connections: Not on file   Smoker: Yes, 6 cigarettes a day Recreational Drug use: Denies  Review of systems: 10 point review of systems performed and negative except as noted in the HPI  Physical Exam: BP 113/77 (BP Location: Right Arm, Patient  Position: Sitting, Cuff Size: Large)   Pulse 97   Ht 5' 3 (1.6 m)   Wt 205 lb (93 kg)   SpO2 99%   BMI 36.31 kg/m  Body mass index is 36.31 kg/m.  Physical Exam           General: Well appearing, no apparent distress. Pulm: Breathing comfortably on room air without sounds/wheezing. CV: Regular rate. Good perfusion of extremities. Chest: Chest wall without abnormality or obvious deformity. No scoliosis, pectus excavatum or pectus carinatum. Abdomen: Soft, non-tender, no distension. No palpable hernia or bulge. Approximately 6 cm diastasis. 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 Skin excess in infraumbilical region with infraumbilical fat deposits.Overhanging pannus hanging above the mons/pubic symphysis. Redundant/ptotic mons tissue.  Excess skin extends  circumferentially along posterior trunk. No current intertriginous rash. Well healed C-section and laparoscopic scars present.  Neuro: Moving all four extremities spontaneously. Psych: Appropriate mood and affect.  Labs: @LASTLABBYCOMP (HGBA1C,TSH)@  @LASTLABBYCOMP (WBC,RBC,HGB,HCT,MCV,MCH,MCHC,RDWSD,PLT,MPV)@  @LASTLABBYCOMP (NA,K,CL,CARDIOXIDE,ANIONGAP,GLUCOSE,BUN,CREATBLD,CALCIUM ,BILITOTAL,ALBUMIN,PROTEINTOT,ALKPHOS,AST,ALT)@  @LASTLABBYCOMP (VITAMINARETI,VITAMINB1WHO,VITB2,PLASMAVITAMI,VITAMINB12LE,D25OHT,FERRITIN,FOLATELEVEL,PTH,TIBC,MAGNESIUM3,PHOSPHORSP,COPPER,ZINCLEVEL,PREALBUMIN)@  Pertinent Imaging:  None  Assessment: This is a 38 y.o. female with history of massive weight loss who presents for consultation for body contouring. The patient has maintained a 73 lb weight loss for over 12 months and has excess skin on their abdomen. Based on the patient's exam and discussion of patient's goals today. We discussed that the patient would only be a candidate for panniculectomy vs. abdominoplasty surgery should she quit smoking and maintain a stable weight >3 months with BMI <40. We  will ask to work with her PCP on stopping tobacco and she will have to pass a urine cotinine test prior to surgery. She will also see healthy weight and wellness to loose more weight as she is considering abdominoplasty, in order to optimize her safety perioperatively and cosmetic results. The patient voiced understanding throughout our discussion today. All questions and concerns were addressed to the patient's apparent satisfaction.  Plan - We will plan to quit smoking and see healthy weight and wellness  - Follow up in 3 months  - The sensitive parts of the examination/procedure were performed with RN as chaperone.  The time documented represents the total time spent on the day of the encounter in preparing for and completing the visit. It does not include time spent by ancillary staff, a resident, a fellow, another trainee, or, for shared visits, time spent jointly with the patient or discussing the case or the performance of other separately performed services.  Time spent: 45 minutes.     Nain Rudd, MD [facility]  09/15/2023 11:15 AM

## 2023-12-16 ENCOUNTER — Ambulatory Visit

## 2024-02-12 ENCOUNTER — Ambulatory Visit
